# Patient Record
Sex: Male | Born: 1955
Health system: Southern US, Community
[De-identification: ages and names within clinical notes are randomized; demographics above are authoritative.]

## PROBLEM LIST (undated history)

## (undated) DIAGNOSIS — K2270 Barrett's esophagus without dysplasia: Secondary | ICD-10-CM

## (undated) DIAGNOSIS — Z973 Presence of spectacles and contact lenses: Secondary | ICD-10-CM

## (undated) DIAGNOSIS — M1990 Unspecified osteoarthritis, unspecified site: Secondary | ICD-10-CM

## (undated) DIAGNOSIS — K219 Gastro-esophageal reflux disease without esophagitis: Secondary | ICD-10-CM

## (undated) DIAGNOSIS — M109 Gout, unspecified: Secondary | ICD-10-CM

## (undated) DIAGNOSIS — Z86010 Personal history of colonic polyps: Principal | ICD-10-CM

## (undated) DIAGNOSIS — E785 Hyperlipidemia, unspecified: Secondary | ICD-10-CM

## (undated) HISTORY — PX: TONSILLECTOMY: SUR1361

## (undated) HISTORY — PX: VASECTOMY: SHX75

## (undated) HISTORY — DX: Gastro-esophageal reflux disease without esophagitis: K21.9

## (undated) HISTORY — PX: NASAL POLYP EXCISION: SHX2068

## (undated) HISTORY — PX: COLONOSCOPY: SHX174

## (undated) HISTORY — DX: Personal history of colonic polyps: Z86.010

## (undated) HISTORY — DX: Gout, unspecified: M10.9

## (undated) HISTORY — DX: Hyperlipidemia, unspecified: E78.5

## (undated) HISTORY — PX: UPPER GASTROINTESTINAL ENDOSCOPY: SHX188

## (undated) HISTORY — DX: Barrett's esophagus without dysplasia: K22.70

---

## 2006-10-30 HISTORY — PX: SHOULDER ARTHROSCOPY W/ ROTATOR CUFF REPAIR: SHX2400

## 2006-10-30 HISTORY — PX: KNEE ARTHROPLASTY: SHX992

## 2007-05-17 ENCOUNTER — Encounter: Admission: RE | Admit: 2007-05-17 | Discharge: 2007-05-17 | Payer: Self-pay | Admitting: Occupational Medicine

## 2007-07-17 ENCOUNTER — Ambulatory Visit (HOSPITAL_BASED_OUTPATIENT_CLINIC_OR_DEPARTMENT_OTHER): Admission: RE | Admit: 2007-07-17 | Discharge: 2007-07-17 | Payer: Self-pay | Admitting: Orthopedic Surgery

## 2007-08-22 ENCOUNTER — Ambulatory Visit (HOSPITAL_BASED_OUTPATIENT_CLINIC_OR_DEPARTMENT_OTHER): Admission: RE | Admit: 2007-08-22 | Discharge: 2007-08-22 | Payer: Self-pay | Admitting: Orthopedic Surgery

## 2008-02-12 ENCOUNTER — Encounter: Payer: Self-pay | Admitting: Family Medicine

## 2008-02-12 LAB — CONVERTED CEMR LAB
Cholesterol: 217 mg/dL
LDL Cholesterol: 140 mg/dL
Triglycerides: 207 mg/dL
VLDL: 41 mg/dL

## 2008-03-03 ENCOUNTER — Ambulatory Visit: Payer: Self-pay | Admitting: Internal Medicine

## 2008-03-13 ENCOUNTER — Encounter: Payer: Self-pay | Admitting: Internal Medicine

## 2008-03-13 ENCOUNTER — Ambulatory Visit: Payer: Self-pay | Admitting: Internal Medicine

## 2008-03-13 DIAGNOSIS — Z860101 Personal history of adenomatous and serrated colon polyps: Secondary | ICD-10-CM | POA: Insufficient documentation

## 2008-03-13 DIAGNOSIS — Z86010 Personal history of colonic polyps: Secondary | ICD-10-CM

## 2008-03-13 DIAGNOSIS — K2270 Barrett's esophagus without dysplasia: Secondary | ICD-10-CM

## 2008-03-13 HISTORY — DX: Barrett's esophagus without dysplasia: K22.70

## 2008-03-13 HISTORY — DX: Personal history of colonic polyps: Z86.010

## 2008-03-17 ENCOUNTER — Encounter: Payer: Self-pay | Admitting: Internal Medicine

## 2008-09-19 IMAGING — CR DG KNEE COMPLETE 4+V*R*
4 series · 4 of 4 positions shown · non-contrast
Comparison: none

CLINICAL DATA: Twisting injury. Pain.
 RIGHT KNEE - 4 VIEW:

[view not recorded (1 of 4)]
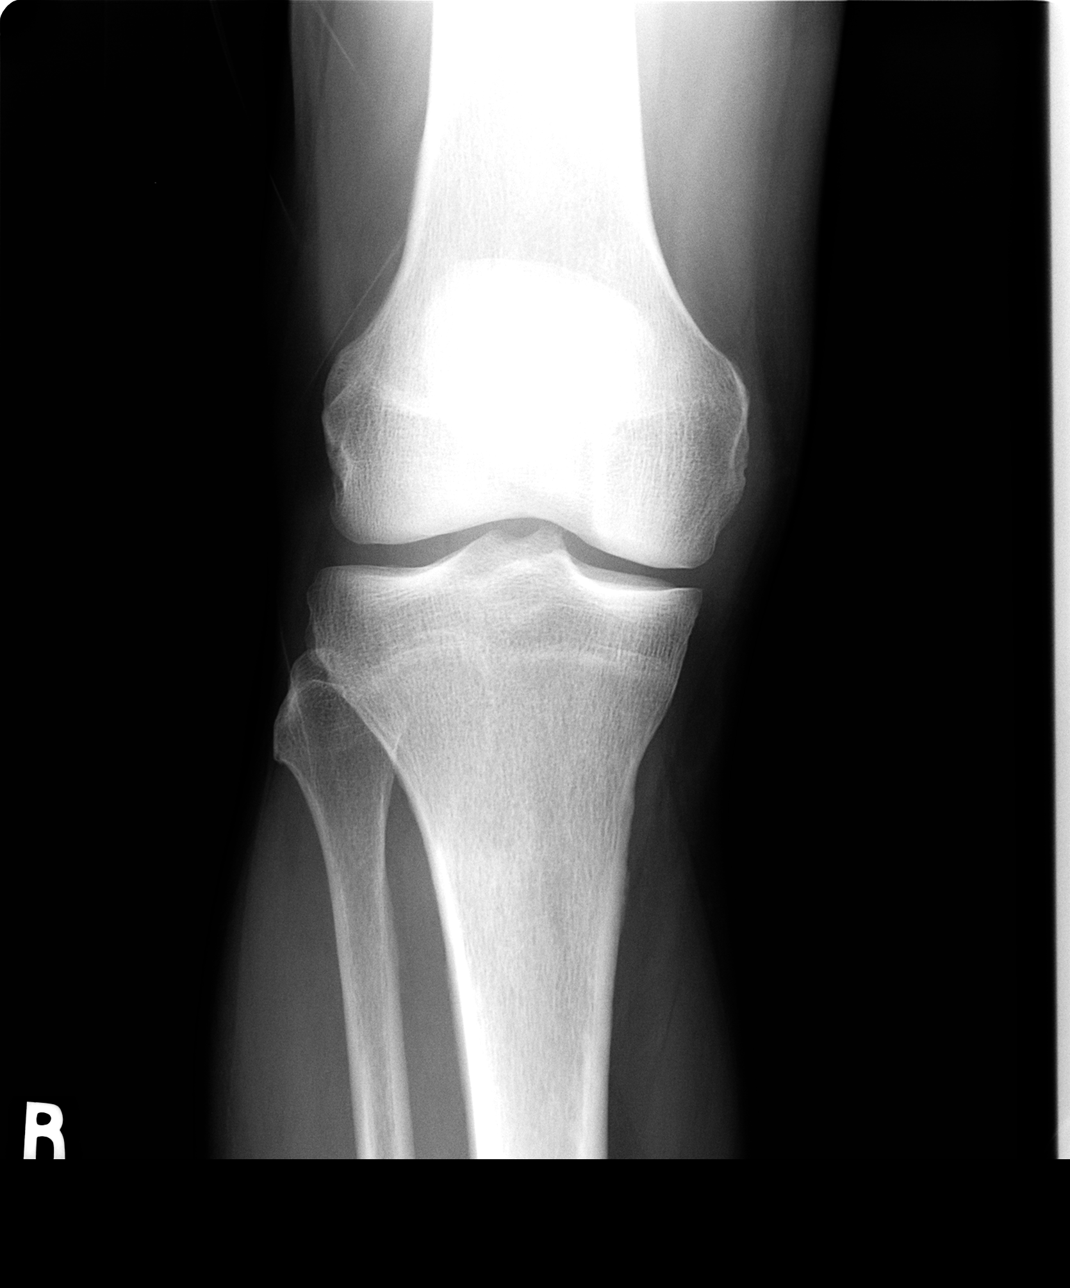

[view not recorded (2 of 4)]
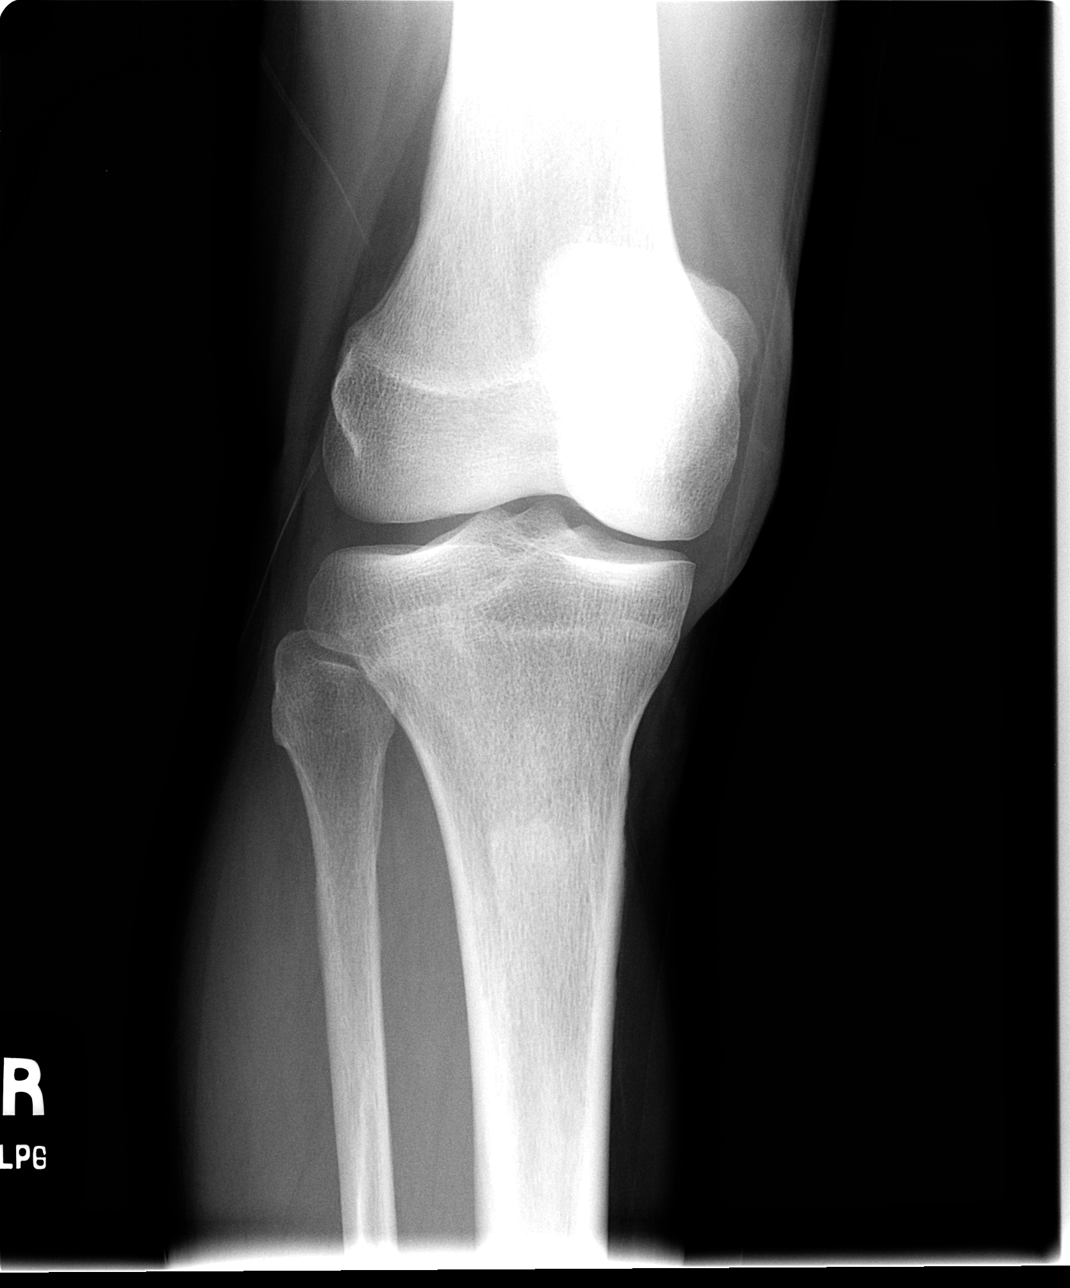

[view not recorded (3 of 4)]
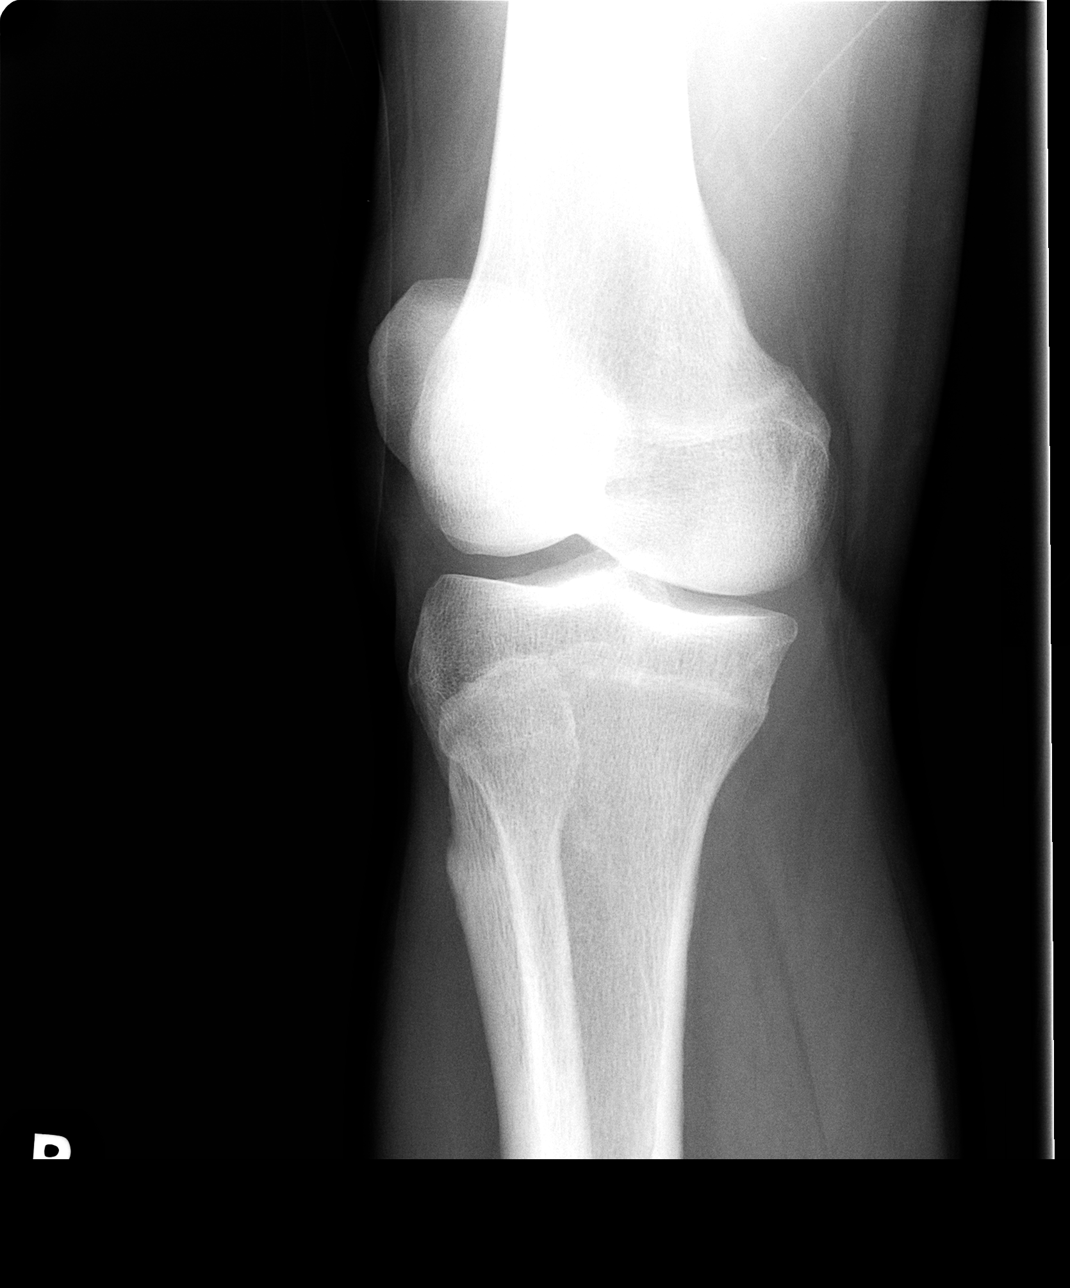

[view not recorded (4 of 4)]
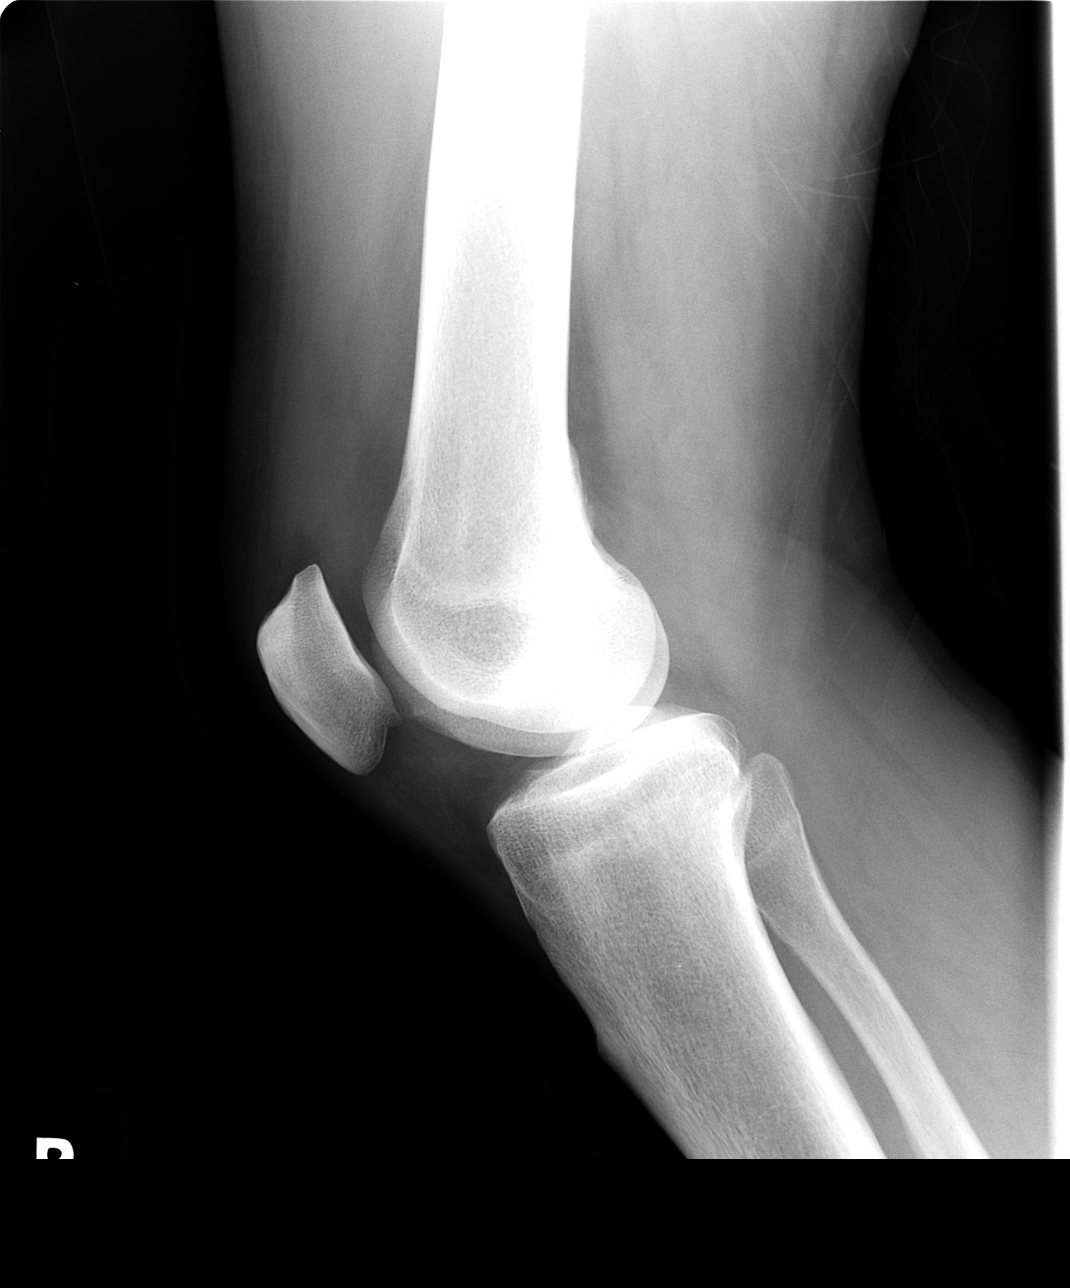

[4 of 4 positions shown; findings below may reference images not displayed]

FINDINGS: The patient has a small joint effusion. No fracture. Mild medial compartment and patellofemoral compartment degenerative change noted.
IMPRESSION: 1.  Negative for fracture.
 2.  Mild appearing patellofemoral medial compartment degenerative disease. 
 3.  Small joint effusion.

## 2009-02-02 ENCOUNTER — Encounter (INDEPENDENT_AMBULATORY_CARE_PROVIDER_SITE_OTHER): Payer: Self-pay | Admitting: *Deleted

## 2009-10-30 HISTORY — PX: SHOULDER ARTHROSCOPY: SHX128

## 2009-11-12 ENCOUNTER — Telehealth: Payer: Self-pay | Admitting: Internal Medicine

## 2010-03-31 ENCOUNTER — Ambulatory Visit (HOSPITAL_BASED_OUTPATIENT_CLINIC_OR_DEPARTMENT_OTHER): Admission: RE | Admit: 2010-03-31 | Discharge: 2010-03-31 | Payer: Self-pay | Admitting: Orthopedic Surgery

## 2010-06-10 ENCOUNTER — Ambulatory Visit: Payer: Self-pay | Admitting: Family Medicine

## 2010-06-10 DIAGNOSIS — E785 Hyperlipidemia, unspecified: Secondary | ICD-10-CM | POA: Insufficient documentation

## 2010-06-10 DIAGNOSIS — K219 Gastro-esophageal reflux disease without esophagitis: Secondary | ICD-10-CM | POA: Insufficient documentation

## 2010-06-10 DIAGNOSIS — E782 Mixed hyperlipidemia: Secondary | ICD-10-CM | POA: Insufficient documentation

## 2010-06-13 LAB — CONVERTED CEMR LAB
AST: 18 units/L (ref 0–37)
Albumin: 4.5 g/dL (ref 3.5–5.2)
BUN: 13 mg/dL (ref 6–23)
Cholesterol: 200 mg/dL (ref 0–200)
Creatinine, Ser: 0.9 mg/dL (ref 0.4–1.5)
GFR calc non Af Amer: 98.99 mL/min (ref >60)
Glucose, Bld: 83 mg/dL (ref 70–99)
HDL: 36.4 mg/dL — ABNORMAL LOW (ref >39.00)
LDL Cholesterol: 135 mg/dL — ABNORMAL HIGH (ref 0–99)
PSA: 0.88 ng/mL (ref 0.10–4.00)
Potassium: 5.1 meq/L (ref 3.5–5.1)
Total Bilirubin: 0.6 mg/dL (ref 0.3–1.2)
Triglycerides: 145 mg/dL (ref 0.0–149.0)
VLDL: 29 mg/dL (ref 0.0–40.0)

## 2010-06-21 ENCOUNTER — Encounter: Payer: Self-pay | Admitting: Family Medicine

## 2010-10-30 HISTORY — PX: KNEE ARTHROSCOPY: SUR90

## 2010-11-30 NOTE — Assessment & Plan Note (Signed)
Summary: NEW PT TO EST/CPX/CLE   Vital Signs:  Patient profile:   55 year old male Height:      68 inches Weight:      161.75 pounds BMI:     24.68 Temp:     98.2 degrees F oral Pulse rate:   80 / minute Pulse rhythm:   regular BP sitting:   102 / 68  (left arm) Cuff size:   regular  Vitals Entered By: Delilah Shan CMA Brylie Sneath Dull) (June 10, 2010 9:47 AM) CC: New Patient to Establish, Preventive Care   History of Present Illness: CPE: prev colonoscopy done.  due for labs.  See prev med.   Insomnia after daughter died in MVA.  Takes xanax rarely.  Mood appropriate.  No SI/HI.  Exercising.   Elevated Cholesterol: due for labs Using medications without problems:yes Muscle aches: no Other complaints: no    Preventive Screening-Counseling & Management  Alcohol-Tobacco     Smoking Status: current     Smoking Cessation Counseling: yes  Caffeine-Diet-Exercise     Does Patient Exercise: yes      Drug Use:  no.    Allergies (verified): 1)  ! Chantix  Past History:  Family History: Last updated: 06/10/2010 Family History Lung cancer, parents Family History of Heart Disease, parents F dead, 20 years of age, h/o MI, CABG, lung CA, smoker, coal miner M dead,  ~55 years of age, colon CA with colostomy  Social History: Last updated: 06/10/2010 Marital Status: Divorced Children: daughter died 41 in MVA.  Has a son also Occupation: Personnel officer at [employer] Current Smoker, <1ppd, working on quitting as of 2011 Alcohol use-yes Drug use-no Regular exercise-yes From [place] enjoys soccer  Past Medical History: HYPERLIPIDEMIA (ICD-272.4) GERD (ICD-530.81) COLONIC POLYPS, HX OF (ICD-V12.72) Insomnia  Past Surgical History: Right and left shoulder- Dr. Eulah Pont with ortho Right knee  Family History: Reviewed history and no changes required. Family History Lung cancer, parents Family History of Heart Disease, parents F dead, 57 years of age, h/o MI, CABG, lung CA,  smoker, coal miner M dead,  ~55 years of age, colon CA with colostomy  Social History: Reviewed history and no changes required. Marital Status: Divorced Children: daughter died 73 in MVA.  Has a son also Occupation: Personnel officer at [employer] Current Smoker, <1ppd, working on quitting as of 2011 Alcohol use-yes Drug use-no Regular exercise-yes From [place] enjoys soccerSmoking Status:  current Drug Use:  no Does Patient Exercise:  yes  Review of Systems       See HPI.  Otherwise negative.    Physical Exam  General:  GEN: nad, alert and oriented HEENT: mucous membranes moist NECK: supple w/o LA CV: rrr.  no murmur PULM: ctab, no inc wob ABD: soft, +bs EXT: no edema SKIN: no acute rash  Rectal:  No external abnormalities noted. Normal sphincter tone. No rectal masses or tenderness. Prostate:  Prostate gland firm and smooth, no enlargement, nodularity, tenderness, mass, asymmetry or induration.   Impression & Recommendations:  Problem # 1:  Preventive Health Care (ICD-V70.0) See prev med.  D/w patient QM:VHQIONGE smoking.  No target stop date yet.   Problem # 2:  HYPERLIPIDEMIA (ICD-272.4) No change in meds. Keep exercising.  His updated medication list for this problem includes:    Lipitor 20 Mg Tabs (Atorvastatin calcium) .Marland Kitchen... Take 1 tab by mouth at bedtime (sometimes).  alt. with red yeast rice  Orders: TLB-Lipid Panel (80061-LIPID) TLB-BMP (Basic Metabolic Panel-BMET) (80048-METABOL) TLB-Hepatic/Liver Function Pnl (80076-HEPATIC)  Problem # 3:  Screening PSA (ICD-V76.44) Normal.  See labs.   Complete Medication List: 1)  Oxycodone-acetaminophen 5-500 Mg Caps (Oxycodone-acetaminophen) .... ? mg.  given by surgeon for shoulder surgery 9 weeks ago. 2)  Lipitor 20 Mg Tabs (Atorvastatin calcium) .... Take 1 tab by mouth at bedtime (sometimes).  alt. with red yeast rice 3)  Xanax 0.25 Mg Tabs (Alprazolam) .... ? mg.   take 1/2 tablet by mouth at bedtime 4)  Aspir-low  81 Mg Tbec (Aspirin) .Marland Kitchen.. 1 by mouth once daily  Other Orders: TLB-PSA (Prostate Specific Antigen) (84153-PSA)  Colorectal Screening:  Current Recommendations:    Hemoccult: NEG X 1 today  Patient Instructions: 1)  We'll contact you with your lab report.  Take care.   Current Allergies (reviewed today): ! CHANTIX    Orders Added: 1)  New Patient 40-64 years [99386] 2)  TLB-PSA (Prostate Specific Antigen) [84153-PSA] 3)  TLB-Lipid Panel [80061-LIPID] 4)  TLB-BMP (Basic Metabolic Panel-BMET) [80048-METABOL] 5)  TLB-Hepatic/Liver Function Pnl [80076-HEPATIC]     Prevention & Chronic Care Immunizations   Influenza vaccine: Not documented   Influenza vaccine due: 06/30/2010    Tetanus booster: Not documented    Pneumococcal vaccine: Not documented   Pneumococcal vaccine due: 02/17/2019  Colorectal Screening   Hemoccult: Not documented   Hemoccult action/deferral: NEG X 1 today  (06/10/2010)    Colonoscopy: Location:  [facility].    (03/13/2008)   Colonoscopy due: 02/2013  Other Screening   PSA: Not documented   PSA ordered.   Smoking status: current  (06/10/2010)   Smoking cessation counseling: yes  (06/10/2010)  Lipids   Total Cholesterol: Not documented   LDL: Not documented   LDL Direct: Not documented   HDL: Not documented   Triglycerides: Not documented    SGOT (AST): Not documented   SGPT (ALT): Not documented   Alkaline phosphatase: Not documented   Total bilirubin: Not documented    Lipid flowsheet reviewed?: Yes  Self-Management Support :    Lipid self-management support: Not documented     Appended Document: NEW PT TO EST/CPX/CLE    Clinical Lists Changes  Observations: Added new observation of PAST MED HX: HYPERLIPIDEMIA (ICD-272.4) GERD (ICD-530.81) COLONIC POLYPS, HX OF (ICD-V12.72) Insomnia Barrett's esophagus 2009 (06/15/2010 19:29) Added new observation of COLONNXTDUE: 03/13/2013 (06/15/2010 19:29) Added  new observation of DM PROGRESS: N/A (06/15/2010 19:29) Added new observation of DM FSREVIEW: N/A (06/15/2010 19:29) Added new observation of HTN PROGRESS: N/A (06/15/2010 19:29) Added new observation of HTN FSREVIEW: N/A (06/15/2010 19:29)       Prevention & Chronic Care Immunizations   Influenza vaccine: Not documented   Influenza vaccine due: 06/30/2010    Tetanus booster: Not documented    Pneumococcal vaccine: Not documented   Pneumococcal vaccine due: 02/17/2019  Colorectal Screening   Hemoccult: Not documented   Hemoccult action/deferral: NEG X 1 today  (06/10/2010)    Colonoscopy: Location:  [facility].    (03/13/2008)   Colonoscopy due: 03/13/2013  Other Screening   PSA: 0.88  (06/10/2010)   Smoking status: current  (06/10/2010)   Smoking cessation counseling: yes  (06/10/2010)  Lipids   Total Cholesterol: 200  (06/10/2010)   LDL: 135  (06/10/2010)   LDL Direct: Not documented   HDL: 36.40  (06/10/2010)   Triglycerides: 145.0  (06/10/2010)    SGOT (AST): 18  (06/10/2010)   SGPT (ALT): 18  (06/10/2010)   Alkaline phosphatase: 79  (06/10/2010)   Total bilirubin: 0.6  (  06/10/2010)  Self-Management Support :    Lipid self-management support: Not documented     Past History:  Past Medical History: HYPERLIPIDEMIA (ICD-272.4) GERD (ICD-530.81) COLONIC POLYPS, HX OF (ICD-V12.72) Insomnia Barrett's esophagus 2009

## 2010-11-30 NOTE — Miscellaneous (Signed)
  Clinical Lists Changes  Observations: Added new observation of TD BOOSTER: Tdap (03/14/2010 17:11) Added new observation of CREATININE: 0.74 mg/dL (69/62/9528 41:32) Added new observation of VLDL: 41 mg/dL (44/10/270 53:66) Added new observation of LDL: 140 mg/dL (44/12/4740 59:56) Added new observation of HDL: 36 mg/dL (38/75/6433 29:51) Added new observation of TRIGLYC TOT: 207 mg/dL (88/41/6606 30:16) Added new observation of CHOLESTEROL: 217 mg/dL (11/07/3233 57:32)      Immunization History:  Tetanus/Td Immunization History:    Tetanus/Td:  tdap (03/14/2010)

## 2010-11-30 NOTE — Progress Notes (Signed)
Summary: Schedule Endoscopy  Phone Note Outgoing Call Call back at [facility] Phone [phone]   Call placed by: Harlow Mares CMA Duncan Dull),  November 12, 2009 1:18 PM Call placed to: Patient Summary of Call: patient states that he can not schedule right now but if I would call him back in [place] he would schedule a repeat EGD to follow up on his Barretts.  Initial call taken by: Harlow Mares CMA Duncan Dull),  November 12, 2009 1:18 PM     Appended Document: Schedule Endoscopy called patient and he is still not ready to schedule his egd but he will call back when he is ready

## 2010-11-30 NOTE — Miscellaneous (Signed)
  Clinical Lists Changes  Observations: Added new observation of PSA: 0.71 ng/mL (02/12/2008 17:14)

## 2011-01-16 LAB — POCT HEMOGLOBIN-HEMACUE: Hemoglobin: 15.6 g/dL (ref 13.0–17.0)

## 2011-03-14 NOTE — Op Note (Signed)
NAMEDIONNE, ROSSA                ACCOUNT NO.:  [id number]   MEDICAL RECORD NO.:  [id number]          PATIENT TYPE:  AMB   LOCATION:  DSC                          FACILITY:  [facility]   PHYSICIAN:  Loreta Ave, M.D. DATE OF BIRTH:  09/03/1956   DATE OF PROCEDURE:  08/22/2007  DATE OF DISCHARGE:                               OPERATIVE REPORT   PREOPERATIVE DIAGNOSIS:  Right shoulder posttraumatic impingement,  partial tearing rotator cuff above and below.  Distal clavicle  osteolysis.   POSTOPERATIVE DIAGNOSIS:  Same, with also complex tearing anterior  posterior labrum.  Secondary adhesive capsulitis.   PROCEDURE:  Right shoulder exam under anesthesia with manipulation.  Arthroscopy, debridement of rotator cuff above and below.  Debridement  of labrum.  Acromioplasty with CA ligament release.  Excision of distal  clavicle.   SURGEON:  Loreta Ave, M.D.   ASSISTANT:  Genene Churn. Denton Meek.   ANESTHESIA:  General.   BLOOD LOSS:  Minimal.   SPECIMENS:  None.   CULTURES:  None.   COMPLICATIONS:  None.   PROCEDURE:  Soft compressive, with sling.   PROCEDURE:  The patient was brought to the operating room and placed on  operating table in the supine position.  After adequate anesthesia had  obtained, right shoulder examined.  Reasonable rotation, but overhead  motion limited by early adhesions from injury and disuse as a result of  pain.  Difficulty getting past 90 degrees of abduction and forward  flexion.  Shoulder manipulated, achieving full motion, maintaining  stable shoulder.  Placed in a beach-chair position on the shoulder  positioner, prepped and draped in the usual sterile fashion.  Three  portals, anterior, posterior, and lateral.  Shoulder entered with blunt  obturator.  Arthroscope introduced.  Shoulder distended inspected.  Residual adhesions and the hemarthrosis from manipulation debrided.  Complex tearing anterior and posterior labrum debrided.  A lot  of  partial tearing throughout the crescent region of the supraspinatus  tendon.  All of this was debrided.  Cable well anchored.  No full-  thickness tears.  Biceps tendon, biceps anchor, capsule ligamentous  structures intact.  Cannula redirected subacromially.  Type 3 acromion.  Reactive bursitis.  Abrasions on the top of the cuff, but no full-  thickness tears.  Bursa resected.  Cuff debrided.  Acromioplasty to a  type 1 acromion, releasing CA ligament with cautery.  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  Periarticular spurs.  Lateral centimeter of clavicle resected.  Adequacy of decompression and  cuff debridement  confirmed, viewing from all portals.  Instruments and fluid removed.  Portals closed with nylon.  Sterile compressive dressing applied.  Sling  applied.  Anesthesia reversed.  Brought to the recovery room.  Tolerated  surgery well.  No complications.      Loreta Ave, M.D.  Electronically Signed     DFM/MEDQ  D:  08/22/2007  T:  08/23/2007  Job:  [id number]

## 2011-03-14 NOTE — Op Note (Signed)
Bryce Cameron, Bryce Cameron                ACCOUNT NO.:  [id number]   MEDICAL RECORD NO.:  [id number]          PATIENT TYPE:  AMB   LOCATION:  DSC                          FACILITY:  [facility]   PHYSICIAN:  Loreta Ave, M.D. DATE OF BIRTH:  1956/03/02   DATE OF PROCEDURE:  07/17/2007  DATE OF DISCHARGE:                               OPERATIVE REPORT   PREOPERATIVE DIAGNOSIS:  Medial meniscus tear, right knee.   POSTOPERATIVE DIAGNOSIS:  Medial meniscus tear, right knee.   PROCEDURE:  Right knee exam under anesthesia, arthroscopy, partial  medial meniscectomy.   SURGEON:  Loreta Ave, M.D.   ASSISTANT:  Genene Churn. Owens, P.A.-C.   ANESTHESIA:  Knee block with sedation.   SPECIMENS:  None.   CULTURES:  None.   COMPLICATIONS:  None.   DRESSINGS:  Soft compressive.   DESCRIPTION OF PROCEDURE:  The patient was brought to the operating room  and placed on the operating table in supine position.  After adequate  anesthesia had been obtained, right knee examined.  Positive medial  McMurray's, full motion, stable ligaments.  Tourniquet and leg holder  were applied.  Leg prepped and draped in the usual sterile fashion.  Three portals, one superolateral, one each medial and lateral  parapatellar.  Inflow catheter introduced, the knee distended, the  arthroscope was introduced, and the knee inspected.  Articular cartilage  intact throughout.  Complex irreparable tearing medial meniscus.  Posterior half removed tapered into remaining meniscus.  Cruciate  ligaments, lateral meniscus, lateral compartment, patellofemoral  tracking were all normal.  Instruments and fluid removed.  Portals of  the knee were injected with Marcaine.  Portals closed with 4-0 nylon.  Sterile compressive dressing applied.  Anesthesia reversed.  Brought to  the recovery room.  Tolerated surgery well.  No complications.      Loreta Ave, M.D.  Electronically Signed     DFM/MEDQ  D:  07/17/2007  T:   07/17/2007  Job:  [id number]

## 2011-03-23 ENCOUNTER — Ambulatory Visit (HOSPITAL_BASED_OUTPATIENT_CLINIC_OR_DEPARTMENT_OTHER)
Admission: RE | Admit: 2011-03-23 | Discharge: 2011-03-23 | Disposition: A | Discharge status: Home / Self Care | Payer: 59 | Source: Ambulatory Visit | Attending: Orthopedic Surgery | Admitting: Orthopedic Surgery

## 2011-03-23 DIAGNOSIS — J4489 Other specified chronic obstructive pulmonary disease: Secondary | ICD-10-CM | POA: Insufficient documentation

## 2011-03-23 DIAGNOSIS — F17200 Nicotine dependence, unspecified, uncomplicated: Secondary | ICD-10-CM | POA: Insufficient documentation

## 2011-03-23 DIAGNOSIS — M6750 Plica syndrome, unspecified knee: Secondary | ICD-10-CM | POA: Insufficient documentation

## 2011-03-23 DIAGNOSIS — J449 Chronic obstructive pulmonary disease, unspecified: Secondary | ICD-10-CM | POA: Insufficient documentation

## 2011-03-23 DIAGNOSIS — M23329 Other meniscus derangements, posterior horn of medial meniscus, unspecified knee: Secondary | ICD-10-CM | POA: Insufficient documentation

## 2011-03-26 NOTE — Op Note (Signed)
  Bryce Cameron, Bryce Cameron                ACCOUNT NO.:  [id number]  MEDICAL RECORD NO.:  [id number]          PATIENT TYPE:  LOCATION:                                 FACILITY:  PHYSICIAN:  Loreta Ave, M.D.      DATE OF BIRTH:  DATE OF PROCEDURE:  03/23/2011 DATE OF DISCHARGE:                              OPERATIVE REPORT   PREOPERATIVE DIAGNOSIS:  Left knee medial meniscus tear.  POSTOPERATIVE DIAGNOSIS:  Left knee medial meniscus tear with also inflamed medial plica.  PROCEDURES:  Left knee exam under anesthesia, arthroscopy with debridement of medial meniscus, excision of medial plica.  SURGEON:  Loreta Ave, MD  ASSISTANT:  Genene Churn. Barry Dienes, [place]  ANESTHESIA:  General.  BLOOD LOSS:  Minimal.  SPECIMENS:  None.  CULTURES:  None.  COMPLICATIONS:  None.  DRESSING:  Soft compressive.  TOURNIQUET:  None employed.  PROCEDURE:  The patient brought to the operating room, placed on the operating table in supine position.  After adequate anesthesia had been obtained, knee examined.  Good motion, good stability.  Leg holder applied.  Leg prepped and draped in usual sterile fashion.  Two portals, one each medial and lateral parapatellar.  Arthroscope was introduced, knee distended and inspected.  Good patellofemoral tracking.  Articular cartilage looked good throughout.  Large fibrotic medial plica with synovitis along the margin was excised.  Some abrasive changes on the margin of the condyle debrided.  Medial meniscus complex tearing middle posterior third with flaps folded underneath.  Saucerized up to a stable rim, tapered into remaining meniscus.  Articular cartilage there looked good.  Cruciate ligaments intact.  Lateral meniscus, lateral compartment looked good.  The entire knee examined, all the findings appreciated.  Instruments and fluid removed.  Intraarticular injection Depo-Medrol and Marcaine.  Portals closed with nylon.  Sterile compressive dressing  applied.  Anesthesia reversed.  Brought to the recovery room.  Tolerated surgery well.  No complications.     Loreta Ave, M.D.     DFM/MEDQ  D:  03/23/2011  T:  03/23/2011  Job:  [id number]  Electronically Signed by Mckinley Jewel M.D. on 03/26/2011 11:38:14 AM

## 2011-08-09 LAB — POCT HEMOGLOBIN-HEMACUE: Operator id: 208731

## 2011-08-10 LAB — POCT HEMOGLOBIN-HEMACUE
Hemoglobin: 17
Operator id: 123881

## 2012-06-12 ENCOUNTER — Ambulatory Visit (INDEPENDENT_AMBULATORY_CARE_PROVIDER_SITE_OTHER): Payer: 59 | Admitting: Family Medicine

## 2012-06-12 VITALS — BP 124/80 | HR 72 | Temp 98.2°F | Resp 16 | Ht 69.0 in | Wt 168.2 lb

## 2012-06-12 DIAGNOSIS — H5711 Ocular pain, right eye: Secondary | ICD-10-CM

## 2012-06-12 DIAGNOSIS — H00019 Hordeolum externum unspecified eye, unspecified eyelid: Secondary | ICD-10-CM

## 2012-06-12 DIAGNOSIS — H5710 Ocular pain, unspecified eye: Secondary | ICD-10-CM

## 2012-06-12 MED ORDER — HYDROCODONE-ACETAMINOPHEN 5-325 MG PO TABS: 1.0000 | ORAL_TABLET | Freq: Four times a day (QID) | ORAL | Status: AC | PRN

## 2012-06-12 MED ORDER — CEPHALEXIN 500 MG PO CAPS: 500.0000 mg | ORAL_CAPSULE | Freq: Two times a day (BID) | ORAL | Status: AC

## 2012-06-12 MED ORDER — ERYTHROMYCIN 5 MG/GM OP OINT: TOPICAL_OINTMENT | OPHTHALMIC | Status: DC

## 2012-06-12 NOTE — Patient Instructions (Signed)
Start ointment for eye, and antibioitic by mouth.  Use hot compresses 3-4 times per day.  If any vision changes, fevers, or other worsening return to clinic or emergency room.  Recheck in 2-3 days if not improving.  Return to the clinic or go to the nearest emergency room if any of your symptoms worsen or new symptoms occur. Sty A sty (hordeolum) is an infection of a gland in the eyelid located at the base of the eyelash. A sty may develop a white or yellow head of pus. It can be puffy (swollen). Usually, the sty will burst and pus will come out on its own. They do not leave lumps in the eyelid once they drain. A sty is often confused with another form of cyst of the eyelid called a chalazion. Chalazions occur within the eyelid and not on the edge where the bases of the eyelashes are. They often are red, sore and then form firm lumps in the eyelid. CAUSES   Germs (bacteria).   Lasting (chronic) eyelid inflammation.  SYMPTOMS   Tenderness, redness and swelling along the edge of the eyelid at the base of the eyelashes.   Sometimes, there is a white or yellow head of pus. It may or may not drain.  DIAGNOSIS  An ophthalmologist will be able to distinguish between a sty and a chalazion and treat the condition appropriately.  TREATMENT   Styes are typically treated with warm packs (compresses) until drainage occurs.   In rare cases, medicines that kill germs (antibiotics) may be prescribed. These antibiotics may be in the form of drops, cream or pills.   If a hard lump has formed, it is generally necessary to do a small incision and remove the hardened contents of the cyst in a minor surgical procedure done in the office.   In suspicious cases, your caregiver may send the contents of the cyst to the lab to be certain that it is not a rare, but dangerous form of cancer of the glands of the eyelid.  HOME CARE INSTRUCTIONS   Wash your hands often and dry them with a clean towel. Avoid touching  your eyelid. This may spread the infection to other parts of the eye.   Apply heat to your eyelid for 10 to 20 minutes, several times a day, to ease pain and help to heal it faster.   Do not squeeze the sty. Allow it to drain on its own. Wash your eyelid carefully 3 to 4 times per day to remove any pus.  SEEK IMMEDIATE MEDICAL CARE IF:   Your eye becomes painful or puffy (swollen).   Your vision changes.   Your sty does not drain by itself within 3 days.   Your sty comes back within a short period of time, even with treatment.   You have redness (inflammation) around the eye.   You have a fever.  Document Released: 07/26/2005 Document Revised: 10/05/2011 Document Reviewed: 03/30/2009 [facility] Patient Information 2012 [place], [place].

## 2012-06-12 NOTE — Progress Notes (Signed)
Subjective:    Patient ID: Bryce Cameron, male    DOB: 09/28/1956, 56 y.o.   MRN: [id number]  HPI Bryce Cameron is a 56 y.o. male Stye in l upper lid one week ago - drained and did ok.  Sweating with playing soccer.  R lower eyelid now bothering him past 5 days.  Tried prior rx drops - unknown name for allergies - no relief.  More swollen, sore.  Ha with pain at times.  Denies blurry vision.  No fever.  Hot compresses once at night.  Less swollen now than this am.  tx ibuprofen 800 mg this am.       Review of Systems  Constitutional: Negative for fever and chills.  Eyes: Positive for pain and redness. Negative for visual disturbance.   As above    Objective:   Physical Exam  Constitutional: He is oriented to person, place, and time. He appears well-developed and well-nourished.  HENT:  Head: Normocephalic and atraumatic.  Eyes: Conjunctivae and EOM are normal. Pupils are equal, round, and reactive to light. Right eye exhibits no discharge, no exudate and no hordeolum. Left eye exhibits discharge (slight crust on lower lid. ) and hordeolum.    Cardiovascular: Normal rate, regular rhythm, normal heart sounds and intact distal pulses.   Pulmonary/Chest: Effort normal.  Neurological: He is alert and oriented to person, place, and time.  Skin: Skin is warm and dry. There is erythema.  Psychiatric: He has a normal mood and affect. His behavior is normal.      Assessment & Plan:  Bryce Cameron is a 56 y.o. male\ 1. Pain in right eye  erythromycin ophthalmic ointment, cephALEXin (KEFLEX) 500 MG capsule, HYDROcodone-acetaminophen (NORCO/VICODIN) 5-325 MG per tablet  2. Hordeolum  erythromycin ophthalmic ointment, cephALEXin (KEFLEX) 500 MG capsule   Stye with worsening pain and periorbital pain.  ddx includes early periorbital cellulitis, but doubt at this point.  meds as above, rtc precautions.  Patient Instructions  Start ointment for eye, and antibioitic by mouth.  Use hot  compresses 3-4 times per day.  If any vision changes, fevers, or other worsening return to clinic or emergency room.  Recheck in 2-3 days if not improving.  Return to the clinic or go to the nearest emergency room if any of your symptoms worsen or new symptoms occur. Sty A sty (hordeolum) is an infection of a gland in the eyelid located at the base of the eyelash. A sty may develop a white or yellow head of pus. It can be puffy (swollen). Usually, the sty will burst and pus will come out on its own. They do not leave lumps in the eyelid once they drain. A sty is often confused with another form of cyst of the eyelid called a chalazion. Chalazions occur within the eyelid and not on the edge where the bases of the eyelashes are. They often are red, sore and then form firm lumps in the eyelid. CAUSES   Germs (bacteria).   Lasting (chronic) eyelid inflammation.  SYMPTOMS   Tenderness, redness and swelling along the edge of the eyelid at the base of the eyelashes.   Sometimes, there is a white or yellow head of pus. It may or may not drain.  DIAGNOSIS  An ophthalmologist will be able to distinguish between a sty and a chalazion and treat the condition appropriately.  TREATMENT   Styes are typically treated with warm packs (compresses) until drainage occurs.   In rare cases, medicines that kill germs (  antibiotics) may be prescribed. These antibiotics may be in the form of drops, cream or pills.   If a hard lump has formed, it is generally necessary to do a small incision and remove the hardened contents of the cyst in a minor surgical procedure done in the office.   In suspicious cases, your caregiver may send the contents of the cyst to the lab to be certain that it is not a rare, but dangerous form of cancer of the glands of the eyelid.  HOME CARE INSTRUCTIONS   Wash your hands often and dry them with a clean towel. Avoid touching your eyelid. This may spread the infection to other parts of  the eye.   Apply heat to your eyelid for 10 to 20 minutes, several times a day, to ease pain and help to heal it faster.   Do not squeeze the sty. Allow it to drain on its own. Wash your eyelid carefully 3 to 4 times per day to remove any pus.  SEEK IMMEDIATE MEDICAL CARE IF:   Your eye becomes painful or puffy (swollen).   Your vision changes.   Your sty does not drain by itself within 3 days.   Your sty comes back within a short period of time, even with treatment.   You have redness (inflammation) around the eye.   You have a fever.  Document Released: 07/26/2005 Document Revised: 10/05/2011 Document Reviewed: 03/30/2009 [facility] Patient Information 2012 [place], [place].

## 2013-04-15 ENCOUNTER — Encounter: Payer: Self-pay | Admitting: Internal Medicine

## 2013-11-07 ENCOUNTER — Other Ambulatory Visit: Payer: Self-pay | Admitting: Physician Assistant

## 2013-11-11 ENCOUNTER — Encounter (HOSPITAL_BASED_OUTPATIENT_CLINIC_OR_DEPARTMENT_OTHER): Payer: Self-pay | Admitting: *Deleted

## 2013-11-11 NOTE — Progress Notes (Signed)
Has been here several times for ortho surgeries-no labs needed
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
## 2013-11-14 ENCOUNTER — Ambulatory Visit (HOSPITAL_BASED_OUTPATIENT_CLINIC_OR_DEPARTMENT_OTHER)
Admission: RE | Admit: 2013-11-14 | Discharge: 2013-11-14 | Disposition: A | Discharge status: Home / Self Care | Payer: Worker's Compensation | Source: Ambulatory Visit | Attending: Orthopedic Surgery | Admitting: Orthopedic Surgery

## 2013-11-14 ENCOUNTER — Encounter (HOSPITAL_BASED_OUTPATIENT_CLINIC_OR_DEPARTMENT_OTHER)
Admission: RE | Disposition: A | Discharge status: Home / Self Care | Payer: Self-pay | Source: Ambulatory Visit | Attending: Orthopedic Surgery

## 2013-11-14 ENCOUNTER — Ambulatory Visit (HOSPITAL_BASED_OUTPATIENT_CLINIC_OR_DEPARTMENT_OTHER): Payer: Worker's Compensation | Admitting: Certified Registered"

## 2013-11-14 ENCOUNTER — Encounter (HOSPITAL_BASED_OUTPATIENT_CLINIC_OR_DEPARTMENT_OTHER): Payer: Self-pay | Admitting: Anesthesiology

## 2013-11-14 ENCOUNTER — Encounter (HOSPITAL_BASED_OUTPATIENT_CLINIC_OR_DEPARTMENT_OTHER): Payer: Worker's Compensation | Admitting: Certified Registered"

## 2013-11-14 DIAGNOSIS — Z87891 Personal history of nicotine dependence: Secondary | ICD-10-CM | POA: Insufficient documentation

## 2013-11-14 DIAGNOSIS — M249 Joint derangement, unspecified: Secondary | ICD-10-CM | POA: Insufficient documentation

## 2013-11-14 DIAGNOSIS — M67919 Unspecified disorder of synovium and tendon, unspecified shoulder: Secondary | ICD-10-CM | POA: Insufficient documentation

## 2013-11-14 DIAGNOSIS — M719 Bursopathy, unspecified: Secondary | ICD-10-CM | POA: Insufficient documentation

## 2013-11-14 DIAGNOSIS — M9420 Chondromalacia, unspecified site: Secondary | ICD-10-CM | POA: Insufficient documentation

## 2013-11-14 HISTORY — PX: SHOULDER INJECTION: SHX5048

## 2013-11-14 HISTORY — PX: ELBOW ARTHROSCOPY: SHX614

## 2013-11-14 HISTORY — DX: Presence of spectacles and contact lenses: Z97.3

## 2013-11-14 HISTORY — DX: Unspecified osteoarthritis, unspecified site: M19.90

## 2013-11-14 LAB — POCT HEMOGLOBIN-HEMACUE: HEMOGLOBIN: 15.4 g/dL (ref 13.0–17.0)

## 2013-11-14 SURGERY — ARTHROSCOPY, ELBOW, WITH OPEN SURGERY IF INDICATED
Anesthesia: General | Site: Shoulder | Laterality: Left

## 2013-11-14 MED ORDER — ONDANSETRON HCL 4 MG PO TABS: 4.0000 mg | ORAL_TABLET | Freq: Four times a day (QID) | ORAL | Status: DC | PRN

## 2013-11-14 MED ORDER — FENTANYL CITRATE 0.05 MG/ML IJ SOLN
INTRAMUSCULAR | Status: AC
  Filled 2013-11-14: qty 4

## 2013-11-14 MED ORDER — ONDANSETRON HCL 4 MG/2ML IJ SOLN: 4.0000 mg | Freq: Once | INTRAMUSCULAR | Status: DC | PRN

## 2013-11-14 MED ORDER — ONDANSETRON HCL 4 MG/2ML IJ SOLN
INTRAMUSCULAR | Status: DC | PRN
  Administered 2013-11-14: 4 mg via INTRAVENOUS

## 2013-11-14 MED ORDER — METHYLPREDNISOLONE ACETATE 40 MG/ML IJ SUSP
INTRAMUSCULAR | Status: AC
  Filled 2013-11-14: qty 1

## 2013-11-14 MED ORDER — BUPIVACAINE HCL (PF) 0.25 % IJ SOLN
INTRAMUSCULAR | Status: AC
  Filled 2013-11-14: qty 30

## 2013-11-14 MED ORDER — MIDAZOLAM HCL 2 MG/2ML IJ SOLN: 1.0000 mg | INTRAMUSCULAR | Status: DC | PRN

## 2013-11-14 MED ORDER — METOCLOPRAMIDE HCL 5 MG PO TABS: 5.0000 mg | ORAL_TABLET | Freq: Three times a day (TID) | ORAL | Status: DC | PRN

## 2013-11-14 MED ORDER — OXYCODONE-ACETAMINOPHEN 5-325 MG PO TABS: 1.0000 | ORAL_TABLET | ORAL | Status: DC | PRN

## 2013-11-14 MED ORDER — FENTANYL CITRATE 0.05 MG/ML IJ SOLN: 50.0000 ug | INTRAMUSCULAR | Status: DC | PRN

## 2013-11-14 MED ORDER — LACTATED RINGERS IV SOLN
INTRAVENOUS | Status: DC
  Administered 2013-11-14: 12:00:00 via INTRAVENOUS

## 2013-11-14 MED ORDER — CHLORHEXIDINE GLUCONATE 4 % EX LIQD: 60.0000 mL | Freq: Once | CUTANEOUS | Status: DC

## 2013-11-14 MED ORDER — METOCLOPRAMIDE HCL 5 MG/ML IJ SOLN: 5.0000 mg | Freq: Three times a day (TID) | INTRAMUSCULAR | Status: DC | PRN

## 2013-11-14 MED ORDER — KETOROLAC TROMETHAMINE 30 MG/ML IJ SOLN
INTRAMUSCULAR | Status: DC | PRN
  Administered 2013-11-14: 30 mg via INTRAVENOUS

## 2013-11-14 MED ORDER — MIDAZOLAM HCL 5 MG/5ML IJ SOLN
INTRAMUSCULAR | Status: DC | PRN
  Administered 2013-11-14: 2 mg via INTRAVENOUS

## 2013-11-14 MED ORDER — MIDAZOLAM HCL 2 MG/2ML IJ SOLN
INTRAMUSCULAR | Status: AC
  Filled 2013-11-14: qty 2

## 2013-11-14 MED ORDER — LACTATED RINGERS IV SOLN: INTRAVENOUS | Status: DC

## 2013-11-14 MED ORDER — DEXAMETHASONE SODIUM PHOSPHATE 4 MG/ML IJ SOLN
INTRAMUSCULAR | Status: DC | PRN
  Administered 2013-11-14: 10 mg via INTRAVENOUS

## 2013-11-14 MED ORDER — ONDANSETRON HCL 4 MG/2ML IJ SOLN: 4.0000 mg | Freq: Four times a day (QID) | INTRAMUSCULAR | Status: DC | PRN

## 2013-11-14 MED ORDER — SODIUM CHLORIDE 0.9 % IR SOLN
Status: DC | PRN
  Administered 2013-11-14: 2000 mL

## 2013-11-14 MED ORDER — PROPOFOL 10 MG/ML IV BOLUS
INTRAVENOUS | Status: DC | PRN
  Administered 2013-11-14: 200 mg via INTRAVENOUS

## 2013-11-14 MED ORDER — METHYLPREDNISOLONE ACETATE 40 MG/ML IJ SUSP
INTRAMUSCULAR | Status: DC | PRN
  Administered 2013-11-14: 13:00:00 via INTRAMUSCULAR

## 2013-11-14 MED ORDER — CEFAZOLIN SODIUM-DEXTROSE 2-3 GM-% IV SOLR
2.0000 g | INTRAVENOUS | Status: AC
  Administered 2013-11-14: 2 g via INTRAVENOUS

## 2013-11-14 MED ORDER — BUPIVACAINE HCL (PF) 0.25 % IJ SOLN
INTRAMUSCULAR | Status: DC | PRN
  Administered 2013-11-14: 10 mL via INTRA_ARTICULAR

## 2013-11-14 MED ORDER — BUPIVACAINE HCL (PF) 0.5 % IJ SOLN
INTRAMUSCULAR | Status: AC
  Filled 2013-11-14: qty 30

## 2013-11-14 MED ORDER — HYDROMORPHONE HCL PF 1 MG/ML IJ SOLN: 0.2500 mg | INTRAMUSCULAR | Status: DC | PRN

## 2013-11-14 MED ORDER — LIDOCAINE HCL (CARDIAC) 20 MG/ML IV SOLN
INTRAVENOUS | Status: DC | PRN
  Administered 2013-11-14: 40 mg via INTRAVENOUS

## 2013-11-14 MED ORDER — FENTANYL CITRATE 0.05 MG/ML IJ SOLN
INTRAMUSCULAR | Status: DC | PRN
  Administered 2013-11-14: 100 ug via INTRAVENOUS
  Administered 2013-11-14 (×3): 25 ug via INTRAVENOUS

## 2013-11-14 SURGICAL SUPPLY — 64 items
BANDAGE ELASTIC 4 VELCRO ST LF (GAUZE/BANDAGES/DRESSINGS) ×6 IMPLANT
BENZOIN TINCTURE PRP APPL 2/3 (GAUZE/BANDAGES/DRESSINGS) IMPLANT
BLADE CUDA GRT WHITE 3.5 (BLADE) IMPLANT
BLADE CUDA SHAVER 3.5 (BLADE) IMPLANT
BLADE CUTTER GATOR 3.5 (BLADE) ×3 IMPLANT
BLADE GREAT WHITE 4.2 (BLADE) IMPLANT
BNDG ESMARK 4X9 LF (GAUZE/BANDAGES/DRESSINGS) ×3 IMPLANT
BUR CUDA 2.9 (BURR) IMPLANT
BUR FULL RADIUS 2.9 (BURR) IMPLANT
BUR GATOR 2.9 (BURR) IMPLANT
BUR OVAL 4.0 (BURR) ×3 IMPLANT
CANISTER SUCT 1200ML W/VALVE (MISCELLANEOUS) IMPLANT
CANISTER SUCT LVC 12 LTR MEDI- (MISCELLANEOUS) ×3 IMPLANT
CUFF TOURNIQUET SINGLE 18IN (TOURNIQUET CUFF) ×3 IMPLANT
CUTTER MENISCUS  4.2MM (BLADE)
CUTTER MENISCUS 4.2MM (BLADE) IMPLANT
DECANTER SPIKE VIAL GLASS SM (MISCELLANEOUS) ×3 IMPLANT
DRAPE ARTHROSCOPY W/POUCH 90 (DRAPES) ×3 IMPLANT
DRAPE OEC MINIVIEW 54X84 (DRAPES) ×3 IMPLANT
DRAPE U-SHAPE 47X51 STRL (DRAPES) ×3 IMPLANT
DURAPREP 26ML APPLICATOR (WOUND CARE) ×3 IMPLANT
GAUZE XEROFORM 1X8 LF (GAUZE/BANDAGES/DRESSINGS) ×3 IMPLANT
GLOVE BIO SURGEON STRL SZ7 (GLOVE) ×3 IMPLANT
GLOVE BIOGEL PI IND STRL 7.0 (GLOVE) ×6 IMPLANT
GLOVE BIOGEL PI IND STRL 7.5 (GLOVE) ×2 IMPLANT
GLOVE BIOGEL PI INDICATOR 7.0 (GLOVE) ×3
GLOVE BIOGEL PI INDICATOR 7.5 (GLOVE) ×1
GLOVE ECLIPSE 6.5 STRL STRAW (GLOVE) ×3 IMPLANT
GLOVE ORTHO TXT STRL SZ7.5 (GLOVE) ×3 IMPLANT
GOWN STRL REUS W/ TWL LRG LVL3 (GOWN DISPOSABLE) ×6 IMPLANT
GOWN STRL REUS W/ TWL XL LVL3 (GOWN DISPOSABLE) ×2 IMPLANT
GOWN STRL REUS W/TWL LRG LVL3 (GOWN DISPOSABLE) ×3
GOWN STRL REUS W/TWL XL LVL3 (GOWN DISPOSABLE) ×1
KIT SHOULDER TRACTION (DRAPES) ×3 IMPLANT
NDL SAFETY ECLIPSE 18X1.5 (NEEDLE) ×2 IMPLANT
NEEDLE HYPO 18GX1.5 SHARP (NEEDLE) ×1
NEEDLE HYPO 25X1 1.5 SAFETY (NEEDLE) IMPLANT
NS IRRIG 1000ML POUR BTL (IV SOLUTION) IMPLANT
PACK ARTHROSCOPY DSU (CUSTOM PROCEDURE TRAY) ×3 IMPLANT
PACK BASIN DAY SURGERY FS (CUSTOM PROCEDURE TRAY) ×3 IMPLANT
PAD CAST 4YDX4 CTTN HI CHSV (CAST SUPPLIES) ×4 IMPLANT
PADDING CAST ABS 4INX4YD NS (CAST SUPPLIES) ×1
PADDING CAST ABS COTTON 4X4 ST (CAST SUPPLIES) ×2 IMPLANT
PADDING CAST COTTON 4X4 STRL (CAST SUPPLIES) ×2
SET ARTHROSCOPY TUBING (MISCELLANEOUS) ×1
SET ARTHROSCOPY TUBING LN (MISCELLANEOUS) ×2 IMPLANT
SHEET MEDIUM DRAPE 40X70 STRL (DRAPES) ×3 IMPLANT
SLEEVE SCD COMPRESS KNEE MED (MISCELLANEOUS) ×3 IMPLANT
SLING ARM FOAM STRAP MED (SOFTGOODS) IMPLANT
SLING ARM LRG ADULT FOAM STRAP (SOFTGOODS) ×3 IMPLANT
SPLINT FAST PLASTER 5X30 (CAST SUPPLIES)
SPLINT PLASTER CAST FAST 5X30 (CAST SUPPLIES) IMPLANT
SPONGE GAUZE 4X4 12PLY (GAUZE/BANDAGES/DRESSINGS) ×6 IMPLANT
STRIP CLOSURE SKIN 1/2X4 (GAUZE/BANDAGES/DRESSINGS) IMPLANT
SUT ETHILON 3 0 PS 1 (SUTURE) IMPLANT
SUT VIC AB 0 CT1 27 (SUTURE)
SUT VIC AB 0 CT1 27XBRD ANBCTR (SUTURE) IMPLANT
SUT VIC AB 2-0 PS2 27 (SUTURE) IMPLANT
SUT VIC AB 2-0 SH 27 (SUTURE) ×1
SUT VIC AB 2-0 SH 27XBRD (SUTURE) ×2 IMPLANT
SYR CONTROL 10ML LL (SYRINGE) ×3 IMPLANT
TOWEL OR 17X24 6PK STRL BLUE (TOWEL DISPOSABLE) ×6 IMPLANT
UNDERPAD 30X30 INCONTINENT (UNDERPADS AND DIAPERS) ×3 IMPLANT
WATER STERILE IRR 1000ML POUR (IV SOLUTION) ×3 IMPLANT

## 2013-11-14 NOTE — Transfer of Care (Signed)
Immediate Anesthesia Transfer of Care Note  Patient: Bryce Cameron  Procedure(s) Performed: Procedure(s): ARTHROSCOPY LEFT ELBOW, SYNOVECTOMY, DEBRIDEMENT, REMOVAL OF LOOSE BODIES (Left) SHOULDER INJECTION (Left)  Patient Location: PACU  Anesthesia Type:General  Level of Consciousness: awake, alert  and oriented  Airway & Oxygen Therapy: Patient Spontanous Breathing and Patient connected to face mask oxygen  Post-op Assessment: Report given to PACU RN and Post -op Vital signs reviewed and stable  Post vital signs: Reviewed and stable  Complications: No apparent anesthesia complications

## 2013-11-14 NOTE — Interval H&P Note (Signed)
History and Physical Interval Note:  11/14/2013 1:04 PM  Bryce Cameron  has presented today for surgery, with the diagnosis of left elbow loose body  The various methods of treatment have been discussed with the patient and family. After consideration of risks, benefits and other options for treatment, the patient has consented to  Procedure(s): ARTHROSCOPY LEFT ELBOW/LOOSE BODY REMOVAL (Left) as a surgical intervention .  The patient's history has been reviewed, patient examined, no change in status, stable for surgery.  I have reviewed the patient's chart and labs.  Questions were answered to the patient's satisfaction. Also subacromial injection for post traumatic impingement left shoulder    Bryce Cameron

## 2013-11-14 NOTE — Anesthesia Procedure Notes (Signed)
Procedure Name: LMA Insertion Performed by: Zahirah Cheslock W Pre-anesthesia Checklist: Patient identified, Timeout performed, Emergency Drugs available, Suction available and Patient being monitored Patient Re-evaluated:Patient Re-evaluated prior to inductionOxygen Delivery Method: Circle system utilized Preoxygenation: Pre-oxygenation with 100% oxygen Intubation Type: IV induction Ventilation: Mask ventilation without difficulty LMA: LMA inserted LMA Size: 4.0 Number of attempts: 1 Placement Confirmation: breath sounds checked- equal and bilateral and positive ETCO2 Tube secured with: Tape Dental Injury: Teeth and Oropharynx as per pre-operative assessment      

## 2013-11-14 NOTE — Anesthesia Preprocedure Evaluation (Signed)
Anesthesia Evaluation  Patient identified by MRN, date of birth, ID band Patient awake    Reviewed: Allergy & Precautions, H&P , NPO status , Patient's Chart, lab work & pertinent test results  Airway Mallampati: II TM Distance: >3 FB Neck ROM: Full    Dental  (+) Teeth Intact and Dental Advisory Given   Pulmonary former smoker,  breath sounds clear to auscultation        Cardiovascular Rhythm:Regular Rate:Normal     Neuro/Psych    GI/Hepatic   Endo/Other    Renal/GU      Musculoskeletal   Abdominal   Peds  Hematology   Anesthesia Other Findings   Reproductive/Obstetrics                           Anesthesia Physical Anesthesia Plan  ASA: II  Anesthesia Plan: General   Post-op Pain Management:    Induction: Intravenous  Airway Management Planned: LMA  Additional Equipment:   Intra-op Plan:   Post-operative Plan:   Informed Consent: I have reviewed the patients History and Physical, chart, labs and discussed the procedure including the risks, benefits and alternatives for the proposed anesthesia with the patient or authorized representative who has indicated his/her understanding and acceptance.   Dental advisory given  Plan Discussed with: CRNA and Anesthesiologist  Anesthesia Plan Comments: (H/O smoking  Plan GA with LMA  Roberts Gaudy, MD)        Anesthesia Quick Evaluation

## 2013-11-14 NOTE — Anesthesia Postprocedure Evaluation (Signed)
  Anesthesia Post-op Note  Patient: [name]  Procedure(s) Performed: Procedure(s): ARTHROSCOPY LEFT ELBOW, SYNOVECTOMY, DEBRIDEMENT, REMOVAL OF LOOSE BODIES (Left) SHOULDER INJECTION (Left)  Patient Location: PACU  Anesthesia Type:General  Level of Consciousness: awake, alert  and oriented  Airway and Oxygen Therapy: Patient Spontanous Breathing  Post-op Pain: mild  Post-op Assessment: Post-op Vital signs reviewed, Patient's Cardiovascular Status Stable, Respiratory Function Stable, Patent Airway and Pain level controlled  Post-op Vital Signs: stable  Complications: No apparent anesthesia complications

## 2013-11-14 NOTE — Discharge Instructions (Signed)
Elbow Arthroscopy, Care After  Keep arm in sling until follow up appointment.  May change dressing on Sunday and apply band-aids.  May shower on Sunday, but do not soak incisions.  May use ice for up to 20 minutes at a time for pain and swelling.  Follow up appointment in our office in one week.      Refer to this sheet in the next few weeks. These instructions provide you with information on caring for yourself after your procedure. Your health care provider may also give you more specific instructions. Your treatment has been planned according to current medical practices, but problems sometimes occur. Call your health care provider if you have any problems or questions after your procedure. WHAT TO EXPECT AFTER THE PROCEDURE Elbows are often swollen, stiff, and sore following surgery. The recovery time will vary with the procedure done. Splints, casts or bandages will be worn for various amounts of time. HOME CARE INSTRUCTIONS   If physical therapy and exercises were prescribed by your surgeon, follow and perform them diligently.  Avoid gripping or lifting heavy objects.  It will be normal to be sore for a couple weeks following surgery. See your health care provider if soreness seems to be getting worse rather than better.  Only take over-the-counter or prescription medicines for pain, discomfort, or fever as directed by your health care provider.  Shower. Do not bathe.  Change dressings if necessary as directed.  You may resume normal diet and activities as directed or allowed.  Make an appointment to see your health care provider for removal of stitches or staples.  Applying an ice pack to the operative site may help with discomfort, keep swelling down, and speed up the healing process. To do this:  Put ice in a bag.  Place a towel between your skin and the bag.  Leave the ice on for 20 minutes, 3 4 times a day, for 2 3 days or as directed by your health care provider.  Do  range of motion exercises with the elbow as directed.  Keep your elbow elevated and wrapped to keep swelling down and to decrease pain. This also speeds healing. SEEK MEDICAL CARE IF:   There is redness, swelling, or increasing pain in the wound or joint.  Pus is coming from wound.  An unexplained fever develops.  You notice a foul smell coming from the wound or dressing.  There is a breaking open of the wound (edges not staying together) after sutures or tape has been removed. SEEK IMMEDIATE MEDICAL CARE IF:   You develop a rash.  You have difficulty breathing.  You have chest pain.  You develop any reaction or side effects to medicines given. Document Released: 05/05/2005 Document Revised: 06/18/2013 Document Reviewed: 05/15/2013 [facility] Patient Information 2014 [place], [place].   Post Anesthesia Home Care Instructions  Activity: Get plenty of rest for the remainder of the day. A responsible adult should stay with you for 24 hours following the procedure.  For the next 24 hours, DO NOT: -Drive a car -Paediatric nurse -Drink alcoholic beverages -Take any medication unless instructed by your physician -Make any legal decisions or sign important papers.  Meals: Start with liquid foods such as gelatin or soup. Progress to regular foods as tolerated. Avoid greasy, spicy, heavy foods. If nausea and/or vomiting occur, drink only clear liquids until the nausea and/or vomiting subsides. Call your physician if vomiting continues.  Special Instructions/Symptoms: Your throat may feel dry or sore from the  anesthesia or the breathing tube placed in your throat during surgery. If this causes discomfort, gargle with warm salt water. The discomfort should disappear within 24 hours.

## 2013-11-17 ENCOUNTER — Encounter (HOSPITAL_BASED_OUTPATIENT_CLINIC_OR_DEPARTMENT_OTHER): Payer: Self-pay | Admitting: Orthopedic Surgery

## 2013-11-17 NOTE — Op Note (Signed)
NAMEMAR, WALMER NO.:  [id number]  MEDICAL RECORD NO.:  [id number]  LOCATION:                               FACILITY:  [facility]  PHYSICIAN:  Ninetta Lights, M.D. DATE OF BIRTH:  July 26, 1956  DATE OF PROCEDURE:  11/14/2013 DATE OF DISCHARGE:  11/14/2013                              OPERATIVE REPORT   PREOPERATIVE DIAGNOSES: 1. Left elbow posttraumatic synovitis, question loose bodies,     posterior impingement. 2. Posttraumatic subacromial bursitis, left shoulder.  POSTOPERATIVE DIAGNOSIS: 1. Left elbow posttraumatic synovitis, anterior and posterior     compartments.  Some mild chondromalacia radial head with chondral     loose bodies.  No large osteochondral loose bodies.  Posterior     posttraumatic impingement with olecranon spurring. 2. Subacromial posttraumatic bursitis, left shoulder.  PROCEDURE: 1. Left elbow exam under anesthesia, arthroscopy.  Synovectomy     anterior posterior.  Chondroplasty anterior posterior.  Posterior     decompression with excision of olecranon spurs. 2. Subacromial injection, left shoulder under sterile technique.  SURGEON:  Ninetta Lights, M.D.  ASSISTANT:  Eula Listen, PA.  ANESTHESIA:  General.  BLOOD LOSS:  Minimal.  SPECIMENS:  None.  CULTURES:  None.  COMPLICATIONS:  None.  DRESSINGS:  Soft compressive.  TOURNIQUET TIME:  One hour.  PROCEDURE:  The patient was brought to the operating room, placed on the operating table in a supine position.  After an adequate anesthesia had been obtained, injected subacromially left shoulder under sterile technique.  I then placed a tourniquet in the upper aspect of the left arm.  Prepped and draped in usual sterile fashion.  Exsanguinated with elevation of Esmarch.  Tourniquet inflated to 250 mmHg.  Two portals, anterolateral and anteromedial.  Arthroscope introduced, elbow distended and inspected.  A lot of reactive synovitis debrided.  Some chondral changes  over the radial head debrided.  Although you could see what appeared to be a loose body on plain x-ray at the interval between the proximal radius, ulna, and humerus, I looked hard throughout that area both front and back, and there was no obvious loose body free in the joint.  Chondral debris removed.  The rest of the front of the elbow looked good.  Two posterior portals.  Reactive synovitis with posterior impingement.  Again, no large loose bodies seen.  Posterior compartment decompressed with synovectomy.  Sequential removal of olecranon spur obliterating all posterior impingement.  Full extension confirmed. Fluoroscopic guidance was used to confirm adequate decompression of the back.  Although I could still see one small fleck on the AP view, I had confirmed very thoroughly this is not loosen within the elbow. Instruments and fluid removed.  Portals injected with Marcaine, closed with nylon.  Sterile compressive dressing applied.  Tourniquet inflated and removed.  Anesthesia reversed.  Brought to the recovery room. Tolerated the surgery well.  No complications.     Ninetta Lights, M.D.     DFM/MEDQ  D:  11/14/2013  T:  11/15/2013  Job:  [id number]

## 2013-11-26 ENCOUNTER — Encounter: Payer: Self-pay | Admitting: Internal Medicine

## 2014-07-22 ENCOUNTER — Encounter: Payer: Self-pay | Admitting: Internal Medicine

## 2014-09-06 ENCOUNTER — Ambulatory Visit (INDEPENDENT_AMBULATORY_CARE_PROVIDER_SITE_OTHER): Payer: 59 | Admitting: Emergency Medicine

## 2014-09-06 VITALS — BP 118/66 | HR 88 | Temp 98.1°F | Resp 18 | Ht 69.0 in | Wt 192.0 lb

## 2014-09-06 DIAGNOSIS — M109 Gout, unspecified: Secondary | ICD-10-CM | POA: Insufficient documentation

## 2014-09-06 MED ORDER — COLCHICINE 0.6 MG PO TABS: ORAL_TABLET | ORAL | Status: DC

## 2014-09-06 MED ORDER — INDOMETHACIN 25 MG PO CAPS: 25.0000 mg | ORAL_CAPSULE | Freq: Three times a day (TID) | ORAL | Status: DC

## 2014-09-06 NOTE — Patient Instructions (Signed)

## 2014-09-06 NOTE — Progress Notes (Signed)
[facility] [address], [place] [address] [phone]  Date:  09/06/2014   Name:  Bryce Cameron   DOB:  04-21-1956   MRN:  [id number]  PCP:  Elsie Stain, MD    Chief Complaint: Gout   History of Present Illness:  Bryce Cameron is a 58 y.o. very pleasant male patient who presents with the following:  Pain in right great toe MTP joint toe since Friday.  Last gout attack years ago.   No history of injury Pain not responding to "cherry juice".  No diuretics or diet change No improvement with over the counter medications or other home remedies.  Denies other complaint or health concern today.   Patient Active Problem List   Diagnosis Date Noted  . Gout 09/06/2014  . HYPERLIPIDEMIA 06/10/2010  . GERD 06/10/2010  . COLONIC POLYPS, HX OF 06/10/2010    Past Medical History  Diagnosis Date  . DJD (degenerative joint disease)   . Wears glasses     Past Surgical History  Procedure Laterality Date  . Tonsillectomy    . Knee arthroplasty  2008    right  . Knee arthroscopy  2012    left  . Shoulder arthroscopy  2011    left  . Shoulder arthroscopy w/ rotator cuff repair  2008    right  . Colonoscopy    . Nasal polyp excision    . Elbow arthroscopy Left 11/14/2013    Procedure: ARTHROSCOPY LEFT ELBOW, SYNOVECTOMY, DEBRIDEMENT, REMOVAL OF LOOSE BODIES;  Surgeon: Ninetta Lights, MD;  Location: [facility];  Service: Orthopedics;  Laterality: Left;  . Shoulder injection Left 11/14/2013    Procedure: SHOULDER INJECTION;  Surgeon: Ninetta Lights, MD;  Location: [facility];  Service: Orthopedics;  Laterality: Left;    History  Substance Use Topics  . Smoking status: Former Smoker    Quit date: 11/11/2012  . Smokeless tobacco: Not on file  . Alcohol Use: Yes    No family history on file.  Allergies  Allergen Reactions  . Varenicline Tartrate     REACTION: intolerant but not allergic    Medication list has been  reviewed and updated.  Current Outpatient Prescriptions on File Prior to Visit  Medication Sig Dispense Refill  . oxyCODONE-acetaminophen (ROXICET) 5-325 MG per tablet Take 1 tablet by mouth every 4 (four) hours as needed for severe pain. 60 tablet 0   No current facility-administered medications on file prior to visit.    Review of Systems:  As per HPI, otherwise negative.    Physical Examination: Filed Vitals:   09/06/14 1117  BP: 118/66  Pulse: 88  Temp: 98.1 F (36.7 C)  Resp: 18   Filed Vitals:   09/06/14 1117  Height: 5\' 9"  (1.753 m)  Weight: 192 lb (87.091 kg)   Body mass index is 28.34 kg/(m^2). Ideal Body Weight: Weight in (lb) to have BMI = 25: 168.9   GEN: WDWN, NAD, Non-toxic, Alert & Oriented x 3 HEENT: Atraumatic, Normocephalic.  Ears and Nose: No external deformity. EXTR: No clubbing/cyanosis/edema NEURO: Normal gait.  PSYCH: Normally interactive. Conversant. Not depressed or anxious appearing.  Calm demeanor.  RIGHT foot:  Exquisitely tender and erythematous swollen right great toe MTP joint   Assessment and Plan: Exacerbation gout colcrys Indocin  Signed,  Ellison Carwin, MD

## 2014-10-30 DIAGNOSIS — M109 Gout, unspecified: Secondary | ICD-10-CM

## 2014-10-30 HISTORY — PX: COLONOSCOPY: SHX174

## 2014-10-30 HISTORY — DX: Gout, unspecified: M10.9

## 2015-02-10 ENCOUNTER — Ambulatory Visit (INDEPENDENT_AMBULATORY_CARE_PROVIDER_SITE_OTHER): Payer: Managed Care, Other (non HMO) | Admitting: Family Medicine

## 2015-02-10 VITALS — BP 110/64 | HR 87 | Temp 97.7°F | Resp 16 | Ht 69.0 in | Wt 196.0 lb

## 2015-02-10 DIAGNOSIS — M1000 Idiopathic gout, unspecified site: Secondary | ICD-10-CM

## 2015-02-10 MED ORDER — FOLIC ACID 1 MG PO TABS: 1.0000 mg | ORAL_TABLET | Freq: Every day | ORAL | Status: DC

## 2015-02-10 MED ORDER — PREDNISONE 20 MG PO TABS: 20.0000 mg | ORAL_TABLET | Freq: Every day | ORAL | Status: DC

## 2015-02-10 NOTE — Patient Instructions (Signed)

## 2015-02-10 NOTE — Progress Notes (Signed)
Patient ID: Bryce Cameron, male   DOB: April 30, 1956, 59 y.o.   MRN: [id number]   This chart was scribed for Robyn Haber, MD by [facility], medical scribe at [facility].The patient was seen in exam room 02 and the patient's care was started at 7:02 PM.  Patient ID: Bryce Cameron MRN: [id number], DOB: 09-Jul-1956, 59 y.o. Date of Encounter: 02/10/2015  Primary Physician: Elsie Stain, MD  Chief Complaint:  Chief Complaint  Patient presents with   Gout   Medication Refill   HPI:  Bryce Cameron is a 59 y.o. male who presents to [facility] complaining of a gout flare up in his right greater toe. Pt states this is his 4 th of 5th flare up. Walking worsens the pain, but  He is currently taking colchicine and is still having gout attacks. His last attack was 2 weeks ago. Pt states he gets a tingling sensation before the gout attack. He is unable to wear his regular shoes, play soccer and play golf due to the pain. He quit smoking 3 years ago and gained 30 pounds since quitting. Pt needs to schedule a physical exam and a colonoscopy. He had an ultrasound of his abdomen which showed a fatty liver. He works for [employer].  Past Medical History  Diagnosis Date   DJD (degenerative joint disease)    Wears glasses     Home Meds: Prior to Admission medications   Medication Sig Start Date End Date Taking? Authorizing Provider  colchicine 0.6 MG tablet 2 now, one in one hour.  Tomorrow one daily Patient not taking: Reported on 02/10/2015 09/06/14   Roselee Culver, MD   Allergies:  Allergies  Allergen Reactions   Varenicline Tartrate     REACTION: intolerant but not allergic   History   Social History   Marital Status: Divorced    Spouse Name: N/A   Number of Children: N/A   Years of Education: N/A   Occupational History   Not on file.   Social History Main Topics   Smoking status: Former Smoker    Quit date: 11/11/2012   Smokeless  tobacco: Not on file   Alcohol Use: Yes   Drug Use: No   Sexual Activity: Not on file   Other Topics Concern   Not on file   Social History Narrative    Review of Systems: Constitutional: negative for chills, fever, night sweats, weight changes, or fatigue  HEENT: negative for vision changes, hearing loss, congestion, rhinorrhea, ST, epistaxis, or sinus pressure Cardiovascular: negative for chest pain or palpitations Respiratory: negative for hemoptysis, wheezing, shortness of breath, or cough Abdominal: negative for abdominal pain, nausea, vomiting, diarrhea, or constipation Msk: Positive for arthralgias, gait problem. Dermatological: negative for rash Neurologic: negative for headache, dizziness, or syncope All other systems reviewed and are otherwise negative with the exception to those above and in the HPI.  Physical Exam: Blood pressure 110/64, pulse 87, temperature 97.7 F (36.5 C), temperature source Oral, resp. rate 16, height 5\' 9"  (1.753 m), weight 196 lb (88.905 kg), SpO2 97 %., Body mass index is 28.93 kg/(m^2). General: Well developed, well nourished, in no acute distress. Head: Normocephalic, atraumatic, eyes without discharge, sclera non-icteric, nares are without discharge. Bilateral auditory canals clear, TM's are without perforation, pearly grey and translucent with reflective cone of light bilaterally. Oral cavity moist, posterior pharynx without exudate, erythema, peritonsillar abscess, or post nasal drip.  Neck: Supple. No thyromegaly. Full ROM.  No lymphadenopathy. Lungs: Clear bilaterally to auscultation without wheezes, rales, or rhonchi. Breathing is unlabored. Heart: RRR with S1 S2. No murmurs, rubs, or gallops appreciated. Abdomen: Soft, non-tender, non-distended with normoactive bowel sounds. No hepatomegaly. No rebound/guarding. No obvious abdominal masses. Msk:  Strength and tone normal for age. Extremities/Skin: Warm and dry. No clubbing or cyanosis.  No edema. No rashes or suspicious lesions. Tender right great toe. Neuro: Alert and oriented X 3. Moves all extremities spontaneously. Gait is normal. CNII-XII grossly in tact. Psych:  Responds to questions appropriately with a normal affect.    ASSESSMENT AND PLAN:  59 y.o. year old male with gout which has not been well controlled with allopurinol. He also needs a physical and preventive care measures. This chart was scribed in my presence and reviewed by me personally.    ICD-9-CM ICD-10-CM   1. Idiopathic gout, unspecified chronicity, unspecified site 274.9 M10.00 Ambulatory referral to Rheumatology     predniSONE (DELTASONE) 20 MG tablet     folic acid (FOLVITE) 1 MG tablet     Ambulatory referral to Gastroenterology     Signed, Robyn Haber, MD  Signed, Robyn Haber, MD 02/10/2015 7:02 PM

## 2015-02-15 ENCOUNTER — Encounter: Payer: Self-pay | Admitting: Internal Medicine

## 2015-03-10 ENCOUNTER — Telehealth: Payer: Self-pay

## 2015-03-10 NOTE — Telephone Encounter (Signed)
ASSESSMENT AND PLAN:  59 y.o. year old male with gout which has not been well controlled with allopurinol. He also needs a physical and preventive care measures. This chart was scribed in my presence and reviewed by me personally.    ICD-9-CM ICD-10-CM   1. Idiopathic gout, unspecified chronicity, unspecified site 274.9 M10.00 Ambulatory referral to Rheumatology     predniSONE (DELTASONE) 20 MG tablet     folic acid (FOLVITE) 1 MG tablet     Ambulatory referral to Gastroenterology

## 2015-03-10 NOTE — Telephone Encounter (Signed)
°  Pt states the medication he was given for his gout isn't helping at all and he is out of all his meds Please call [phone]     [facility] [place]

## 2015-03-10 NOTE — Telephone Encounter (Signed)
If the medication did not work, why wait 3+ weeks to let us know?  He is supposed to have a rheumatology appt.  If he is unhapppy with current status, please return for reevaluatio.

## 2015-03-11 NOTE — Telephone Encounter (Signed)
Spoke with pt, advised message  From Dr. Carlean Jews. Pt understood.

## 2015-03-16 ENCOUNTER — Encounter: Payer: Self-pay | Admitting: Internal Medicine

## 2015-04-15 ENCOUNTER — Encounter (INDEPENDENT_AMBULATORY_CARE_PROVIDER_SITE_OTHER): Payer: Self-pay

## 2015-04-15 ENCOUNTER — Ambulatory Visit (AMBULATORY_SURGERY_CENTER): Payer: Self-pay | Admitting: *Deleted

## 2015-04-15 VITALS — Ht 69.0 in | Wt 186.0 lb

## 2015-04-15 DIAGNOSIS — Z86010 Personal history of colonic polyps: Secondary | ICD-10-CM

## 2015-04-15 NOTE — Progress Notes (Signed)
No home 02 No post op anesthesia complications No diet drugs

## 2015-04-30 ENCOUNTER — Encounter: Payer: Self-pay | Admitting: Internal Medicine

## 2015-04-30 ENCOUNTER — Ambulatory Visit (AMBULATORY_SURGERY_CENTER): Payer: Managed Care, Other (non HMO) | Admitting: Internal Medicine

## 2015-04-30 VITALS — BP 107/77 | HR 78 | Temp 97.2°F | Resp 11 | Ht 69.0 in | Wt 196.0 lb

## 2015-04-30 DIAGNOSIS — D125 Benign neoplasm of sigmoid colon: Secondary | ICD-10-CM

## 2015-04-30 DIAGNOSIS — D122 Benign neoplasm of ascending colon: Secondary | ICD-10-CM

## 2015-04-30 DIAGNOSIS — Z86010 Personal history of colonic polyps: Secondary | ICD-10-CM

## 2015-04-30 DIAGNOSIS — K2270 Barrett's esophagus without dysplasia: Secondary | ICD-10-CM

## 2015-04-30 DIAGNOSIS — D120 Benign neoplasm of cecum: Secondary | ICD-10-CM

## 2015-04-30 DIAGNOSIS — D124 Benign neoplasm of descending colon: Secondary | ICD-10-CM | POA: Diagnosis not present

## 2015-04-30 MED ORDER — SODIUM CHLORIDE 0.9 % IV SOLN: 500.0000 mL | INTRAVENOUS | Status: DC

## 2015-04-30 NOTE — Progress Notes (Signed)
To recovery, report to Smith, RN, VSS 

## 2015-04-30 NOTE — Patient Instructions (Addendum)
I found and removed 5 small polyps. I will let you know pathology results and when to have another routine colonoscopy by mail.  You should also have follow-up of the Barrett's esophagus arranged - I will discuss with you after I get these results back.  If you want to check with your insurance company the CPT code for upper endoscopy and biopsy would be 43239 and diagnosis is barrett's esophagus.  I appreciate the opportunity to care for you. Gatha Mayer, MD, FACG YOU HAD AN ENDOSCOPIC PROCEDURE TODAY AT [facility]:   Refer to the procedure report that was given to you for any specific questions about what was found during the examination.  If the procedure report does not answer your questions, please call your gastroenterologist to clarify.  If you requested that your care partner not be given the details of your procedure findings, then the procedure report has been included in a sealed envelope for you to review at your convenience later.  YOU SHOULD EXPECT: Some feelings of bloating in the abdomen. Passage of more gas than usual.  Walking can help get rid of the air that was put into your GI tract during the procedure and reduce the bloating. If you had a lower endoscopy (such as a colonoscopy or flexible sigmoidoscopy) you may notice spotting of blood in your stool or on the toilet paper. If you underwent a bowel prep for your procedure, you may not have a normal bowel movement for a few days.  Please Note:  You might notice some irritation and congestion in your nose or some drainage.  This is from the oxygen used during your procedure.  There is no need for concern and it should clear up in a day or so.  SYMPTOMS TO REPORT IMMEDIATELY:   Following lower endoscopy (colonoscopy or flexible sigmoidoscopy):  Excessive amounts of blood in the stool  Significant tenderness or worsening of abdominal pains  Swelling of the abdomen that is new, acute  Fever of 100F  or higher    For urgent or emergent issues, a gastroenterologist can be reached at any hour by calling [phone].   DIET: Your first meal following the procedure should be a small meal and then it is ok to progress to your normal diet. Heavy or fried foods are harder to digest and may make you feel nauseous or bloated.  Likewise, meals heavy in dairy and vegetables can increase bloating.  Drink plenty of fluids but you should avoid alcoholic beverages for 24 hours.  ACTIVITY:  You should plan to take it easy for the rest of today and you should NOT DRIVE or use heavy machinery until tomorrow (because of the sedation medicines used during the test).    FOLLOW UP: Our staff will call the number listed on your records the next business day following your procedure to check on you and address any questions or concerns that you may have regarding the information given to you following your procedure. If we do not reach you, we will leave a message.  However, if you are feeling well and you are not experiencing any problems, there is no need to return our call.  We will assume that you have returned to your regular daily activities without incident.  If any biopsies were taken you will be contacted by phone or by letter within the next 1-3 weeks.  Please call us at [phone] if you have not heard about the biopsies in  3 weeks.    SIGNATURES/CONFIDENTIALITY: You and/or your care partner have signed paperwork which will be entered into your electronic medical record.  These signatures attest to the fact that that the information above on your After Visit Summary has been reviewed and is understood.  Full responsibility of the confidentiality of this discharge information lies with you and/or your care-partner.

## 2015-04-30 NOTE — Op Note (Signed)
[place]  [address] [place], [id number]   COLONOSCOPY PROCEDURE REPORT  PATIENT: Bryce Cameron, Bryce Cameron  MR#: [id number] BIRTHDATE: 05-Jul-1956 , 59  yrs. old GENDER: male ENDOSCOPIST: Gatha Mayer, MD, [facility] PROCEDURE DATE:  04/30/2015 PROCEDURE:   Colonoscopy with biopsy and Colonoscopy with snare polypectomy First Screening Colonoscopy - Avg.  risk and is 50 yrs.  old or older - No.  Prior Negative Screening - Now for repeat screening. N/A  History of Adenoma - Now for follow-up colonoscopy & has been > or = to 3 yrs.  Yes hx of adenoma.  Has been 3 or more years since last colonoscopy.  Polyps removed today? Yes ASA CLASS:   Class II INDICATIONS:Surveillance due to prior colonic neoplasia and PH Colon Adenoma. MEDICATIONS: Propofol 400 mg IV and Monitored anesthesia care  DESCRIPTION OF PROCEDURE:   After the risks benefits and alternatives of the procedure were thoroughly explained, informed consent was obtained.  The digital rectal exam revealed no abnormalities of the rectum, revealed the prostate was not enlarged, and revealed no prostatic nodules.   The [id number]  endoscope was introduced through the anus and advanced to the cecum, which was identified by both the appendix and ileocecal valve. No adverse events experienced.   The quality of the prep was adequate (MiraLax was used)  The instrument was then slowly withdrawn as the colon was fully examined. Estimated blood loss is zero unless otherwise noted in this procedure report.  COLON FINDINGS: Five sessile polyps ranging from 2 to 46mm in size were found at the cecum, in the ascending colon, and descending colon.  Polypectomies were performed with cold forceps and with a cold snare.  The resection was complete, the polyp tissue was completely retrieved and sent to histology.   There was diverticulosis noted in the sigmoid colon.   The examination was otherwise normal.  Retroflexed views  revealed no abnormalities. The time to cecum = 1.8 Withdrawal time = 25.0   The scope was withdrawn and the procedure completed. COMPLICATIONS: There were no immediate complications.  ENDOSCOPIC IMPRESSION: 1.   Five sessile polyps ranging from 2 to 5mm in size were found at the cecum, in the ascending colon, and descending colon; polypectomies were performed with cold forceps and with a cold snare 2.   Diverticulosis was noted in the sigmoid colon 3.   The examination was otherwise normal  RECOMMENDATIONS: 1.  Timing of repeat colonoscopy will be determined by pathology findings. 2.  Better fiber restriction next time 3.  Has hx of Barrett's and has not had f/u EGD - will review with him after colon pathology back  eSigned:  Gatha Mayer, MD, [facility] 04/30/2015 9:21 AM  cc: The Patient

## 2015-04-30 NOTE — Progress Notes (Signed)
Called to room to assist during endoscopic procedure.  Patient ID and intended procedure confirmed with present staff. Received instructions for my participation in the procedure from the performing physician.  

## 2015-05-04 ENCOUNTER — Telehealth: Payer: Self-pay

## 2015-05-04 NOTE — Telephone Encounter (Signed)
  Follow up Call-  Call back number 04/30/2015  Post procedure Call Back phone  # [phone]  Permission to leave phone message Yes     Patient questions:  Do you have a fever, pain , or abdominal swelling? No. Pain Score  0 *  Have you tolerated food without any problems? Yes.    Have you been able to return to your normal activities? Yes.    Do you have any questions about your discharge instructions: Diet   No. Medications  No. Follow up visit  No.  Do you have questions or concerns about your Care? No.  Actions: * If pain score is 4 or above: No action needed, pain <4.

## 2015-05-07 ENCOUNTER — Telehealth: Payer: Self-pay | Admitting: Internal Medicine

## 2015-05-07 NOTE — Telephone Encounter (Signed)
Without other GI sxs does not seem likely to be related to the procedure and assuming IV site ok also Would ask him to see PCP or urgent care for this please  Let me know

## 2015-05-07 NOTE — Telephone Encounter (Signed)
Patient notified of recommendations. He states the IV site is fine.

## 2015-05-07 NOTE — Telephone Encounter (Signed)
Patient had a colonoscopy on 04/30/15. He states he is running a temperature of 101-101.3 in evening. He is taking Advil in AM and PM. His temperature this AM was 98.9. He also reports a headache and states "my vision is off."  No pain or other symptoms. Please, advise.

## 2015-05-10 NOTE — Progress Notes (Signed)
Quick Note:  3 adenomas and an ssp Repeat colonoscopy 3 yrs  Call form office with results and also he is overdue for a Barrett's surveillance egd which I recommend he schedule  [facility] - enter colon recall for 04/2018 ______

## 2017-11-28 DIAGNOSIS — M109 Gout, unspecified: Secondary | ICD-10-CM | POA: Diagnosis not present

## 2017-11-28 DIAGNOSIS — M1000 Idiopathic gout, unspecified site: Secondary | ICD-10-CM | POA: Diagnosis not present

## 2017-11-28 DIAGNOSIS — I10 Essential (primary) hypertension: Secondary | ICD-10-CM | POA: Diagnosis not present

## 2018-01-08 DIAGNOSIS — Z01118 Encounter for examination of ears and hearing with other abnormal findings: Secondary | ICD-10-CM | POA: Diagnosis not present

## 2018-01-08 DIAGNOSIS — R05 Cough: Secondary | ICD-10-CM | POA: Diagnosis not present

## 2018-01-08 DIAGNOSIS — Z131 Encounter for screening for diabetes mellitus: Secondary | ICD-10-CM | POA: Diagnosis not present

## 2018-01-08 DIAGNOSIS — K219 Gastro-esophageal reflux disease without esophagitis: Secondary | ICD-10-CM | POA: Diagnosis not present

## 2018-01-08 DIAGNOSIS — Z136 Encounter for screening for cardiovascular disorders: Secondary | ICD-10-CM | POA: Diagnosis not present

## 2018-01-08 DIAGNOSIS — M109 Gout, unspecified: Secondary | ICD-10-CM | POA: Diagnosis not present

## 2018-01-08 DIAGNOSIS — Z0000 Encounter for general adult medical examination without abnormal findings: Secondary | ICD-10-CM | POA: Diagnosis not present

## 2018-04-09 DIAGNOSIS — K219 Gastro-esophageal reflux disease without esophagitis: Secondary | ICD-10-CM | POA: Diagnosis not present

## 2018-04-09 DIAGNOSIS — Z01118 Encounter for examination of ears and hearing with other abnormal findings: Secondary | ICD-10-CM | POA: Diagnosis not present

## 2018-04-09 DIAGNOSIS — M109 Gout, unspecified: Secondary | ICD-10-CM | POA: Diagnosis not present

## 2018-04-09 DIAGNOSIS — Z131 Encounter for screening for diabetes mellitus: Secondary | ICD-10-CM | POA: Diagnosis not present

## 2018-04-09 DIAGNOSIS — Z0000 Encounter for general adult medical examination without abnormal findings: Secondary | ICD-10-CM | POA: Diagnosis not present

## 2018-04-09 DIAGNOSIS — Z136 Encounter for screening for cardiovascular disorders: Secondary | ICD-10-CM | POA: Diagnosis not present

## 2018-05-28 DIAGNOSIS — R55 Syncope and collapse: Secondary | ICD-10-CM | POA: Diagnosis not present

## 2018-05-28 DIAGNOSIS — K219 Gastro-esophageal reflux disease without esophagitis: Secondary | ICD-10-CM | POA: Diagnosis not present

## 2018-05-28 DIAGNOSIS — E782 Mixed hyperlipidemia: Secondary | ICD-10-CM | POA: Diagnosis not present

## 2018-06-07 DIAGNOSIS — Z0189 Encounter for other specified special examinations: Secondary | ICD-10-CM | POA: Diagnosis not present

## 2018-06-07 DIAGNOSIS — R55 Syncope and collapse: Secondary | ICD-10-CM | POA: Diagnosis not present

## 2018-06-07 DIAGNOSIS — E782 Mixed hyperlipidemia: Secondary | ICD-10-CM | POA: Diagnosis not present

## 2018-08-26 ENCOUNTER — Encounter: Payer: Self-pay | Admitting: Internal Medicine

## 2019-11-25 ENCOUNTER — Telehealth: Payer: Self-pay | Admitting: Family Medicine

## 2019-11-25 NOTE — Telephone Encounter (Signed)
Pt called to schedule a new patient appt. Are you okay continuing seeing him as your patient even though it has been so long or does he need to re-establish with another provider?

## 2019-11-25 NOTE — Telephone Encounter (Signed)
I would see if anyone else has a slot for a new patient given my current patient panel.  Thanks.

## 2020-01-02 ENCOUNTER — Ambulatory Visit: Payer: Self-pay | Attending: Internal Medicine

## 2020-01-02 DIAGNOSIS — Z23 Encounter for immunization: Secondary | ICD-10-CM | POA: Insufficient documentation

## 2020-01-02 NOTE — Progress Notes (Signed)
   Covid-19 Vaccination Clinic  Name:  Bryce Cameron    MRN: [id number] DOB: [birth year]  01/02/2020  [name] was observed post Covid-19 immunization for 15 minutes without incident. He was provided with Vaccine Information Sheet and instruction to access the V-Safe system.   [name] was instructed to call 911 with any severe reactions post vaccine: Marland Kitchen Difficulty breathing  . Swelling of face and throat  . A fast heartbeat  . A bad rash all over body  . Dizziness and weakness   Immunizations Administered    Name Date Dose VIS Date Route   Pfizer COVID-19 Vaccine 01/02/2020  8:46 AM 0.3 mL 10/10/2019 Intramuscular   Manufacturer: [place]   Lot: [id number]   [place]: [id number]

## 2020-01-06 ENCOUNTER — Other Ambulatory Visit: Payer: Self-pay

## 2020-01-06 ENCOUNTER — Encounter: Payer: Self-pay | Admitting: Family Medicine

## 2020-01-06 ENCOUNTER — Ambulatory Visit (INDEPENDENT_AMBULATORY_CARE_PROVIDER_SITE_OTHER): Payer: Medicare PPO | Admitting: Family Medicine

## 2020-01-06 VITALS — BP 114/72 | HR 95 | Temp 98.4°F | Resp 10 | Ht 68.5 in | Wt 185.8 lb

## 2020-01-06 DIAGNOSIS — Z136 Encounter for screening for cardiovascular disorders: Secondary | ICD-10-CM | POA: Diagnosis not present

## 2020-01-06 DIAGNOSIS — R399 Unspecified symptoms and signs involving the genitourinary system: Secondary | ICD-10-CM

## 2020-01-06 DIAGNOSIS — Z125 Encounter for screening for malignant neoplasm of prostate: Secondary | ICD-10-CM | POA: Diagnosis not present

## 2020-01-06 DIAGNOSIS — N529 Male erectile dysfunction, unspecified: Secondary | ICD-10-CM

## 2020-01-06 DIAGNOSIS — B078 Other viral warts: Secondary | ICD-10-CM | POA: Insufficient documentation

## 2020-01-06 DIAGNOSIS — Z114 Encounter for screening for human immunodeficiency virus [HIV]: Secondary | ICD-10-CM | POA: Diagnosis not present

## 2020-01-06 DIAGNOSIS — Z1159 Encounter for screening for other viral diseases: Secondary | ICD-10-CM

## 2020-01-06 DIAGNOSIS — Z86010 Personal history of colonic polyps: Secondary | ICD-10-CM

## 2020-01-06 DIAGNOSIS — Z113 Encounter for screening for infections with a predominantly sexual mode of transmission: Secondary | ICD-10-CM

## 2020-01-06 LAB — COMPREHENSIVE METABOLIC PANEL
ALT: 33 U/L (ref 0–53)
AST: 22 U/L (ref 0–37)
Albumin: 4.5 g/dL (ref 3.5–5.2)
Alkaline Phosphatase: 84 U/L (ref 39–117)
BUN: 17 mg/dL (ref 6–23)
CO2: 28 mEq/L (ref 19–32)
Calcium: 10.1 mg/dL (ref 8.4–10.5)
Chloride: 102 mEq/L (ref 96–112)
Creatinine, Ser: 0.79 mg/dL (ref 0.40–1.50)
GFR: 98.17 mL/min (ref >60.00)
Glucose, Bld: 93 mg/dL (ref 70–99)
Potassium: 4.8 mEq/L (ref 3.5–5.1)
Sodium: 137 mEq/L (ref 135–145)
Total Bilirubin: 0.5 mg/dL (ref 0.2–1.2)
Total Protein: 7.5 g/dL (ref 6.0–8.3)

## 2020-01-06 LAB — LIPID PANEL
Cholesterol: 210 mg/dL — ABNORMAL HIGH (ref 0–200)
HDL: 33.8 mg/dL — ABNORMAL LOW (ref >39.00)
LDL Cholesterol: 138 mg/dL — ABNORMAL HIGH (ref 0–99)
NonHDL: 176.55
Total CHOL/HDL Ratio: 6
Triglycerides: 195 mg/dL — ABNORMAL HIGH (ref 0.0–149.0)
VLDL: 39 mg/dL (ref 0.0–40.0)

## 2020-01-06 NOTE — Assessment & Plan Note (Signed)
Failed viagra. Discussed cialis. Not interested at this time. He will reach out if he becomes interested again.

## 2020-01-06 NOTE — Assessment & Plan Note (Signed)
Encouraged patient to return for follow-up. He is overdue

## 2020-01-06 NOTE — Progress Notes (Signed)
Subjective:     Bryce Cameron is a 64 y.o. male presenting for Establish Care (last previous PCP with [facility] in [place]), Warts (on left arm and both knees.), and Discuss prostate (what is normal and what is not. Labs)     HPI  #Bladder/urination concern - started in the last year - has been less active in general in the last year - things it started with pain in the tailbone and numbness in the legs - getting buttock pain from driving - getting up 1-2 times at night depending on what he drank during the day - will get pressure in the tip of his penis when he has to urinate - no burning sensation  #erectile dysfunction - issues sustaining erection - no cp or sob with activity - erection issues 1-2 years ago - tried Viagra w/ side effects - did not try other medications    Review of Systems   Social History   Tobacco Use  Smoking Status Former Smoker  . Packs/day: 0.50  . Years: 25.00  . Pack years: 12.50  . Types: Cigarettes  . Quit date: 11/12/2011  . Years since quitting: 8.1  Smokeless Tobacco Never Used        Objective:    BP Readings from Last 3 Encounters:  01/06/20 114/72  04/30/15 107/77  02/10/15 110/64   Wt Readings from Last 3 Encounters:  01/06/20 185 lb 12 oz (84.3 kg)  04/30/15 196 lb (88.9 kg)  04/15/15 186 lb (84.4 kg)    BP 114/72   Pulse 95   Temp 98.4 F (36.9 C)   Resp 10   Ht 5' 8.5" (1.74 m)   Wt 185 lb 12 oz (84.3 kg)   BMI 27.83 kg/m    Physical Exam Constitutional:      Appearance: Normal appearance. He is not ill-appearing or diaphoretic.  HENT:     Right Ear: External ear normal.     Left Ear: External ear normal.     Nose: Nose normal.  Eyes:     General: No scleral icterus.    Extraocular Movements: Extraocular movements intact.     Conjunctiva/sclera: Conjunctivae normal.  Cardiovascular:     Rate and Rhythm: Normal rate and regular rhythm.     Heart sounds: No murmur.  Pulmonary:     Effort:  Pulmonary effort is normal. No respiratory distress.     Breath sounds: Normal breath sounds. No wheezing.  Musculoskeletal:     Cervical back: Neck supple.  Skin:    General: Skin is warm and dry.     Comments: Several verrucous lesions - 3 on the right knee, 1 on the left knee, and 1 on the left posterior arm proximal to the elbow.   Neurological:     Mental Status: He is alert. Mental status is at baseline.  Psychiatric:        Mood and Affect: Mood normal.           Assessment & Plan:   Problem List Items Addressed This Visit      Other   Hx of adenomatous polyp of colon    Encouraged patient to return for follow-up. He is overdue      Common wart - Primary    See procedure note below. Pt tolerated procedure well. Return if lesions return      Erectile dysfunction    Failed viagra. Discussed cialis. Not interested at this time. He will reach out if he becomes  interested again.       Relevant Orders   Comprehensive metabolic panel (Completed)   Lower urinary tract symptoms    Discussed that he lacks common symptoms of BPH. Not sure what to make of the pressure he feels in the lower penis when he needs to urinate but without difficulty emptying, frequency, burning, nocturia will get PSA and monitor. Will also get STI testing.        Other Visit Diagnoses    Routine screening for STI (sexually transmitted infection)       Relevant Orders   C. trachomatis/N. gonorrhoeae RNA   Encounter for special screening examination for cardiovascular disorder       Relevant Orders   Lipid panel (Completed)   Need for hepatitis C screening test       Relevant Orders   Hepatitis C antibody   Encounter for screening for HIV       Relevant Orders   HIV Antibody (routine testing w rflx)   Prostate cancer screening       Relevant Orders   PSA, Total with Reflex to PSA, Free     Procedure: Liquid Nitrogen for warts  Discussed risk benefits of the procedure - including risk  of blistering, risk of incomplete removal, and risk of infection.   Liquid nitrogen was applied to skin areas of warts (Right knee - 3 lesions, left knee, and left elbow) x 3 rounds with adequate freezing and thawing cycles.   Pt was advised to watch for infection and return if incomplete removal  No follow-ups on file.  Lesleigh Noe, MD

## 2020-01-06 NOTE — Assessment & Plan Note (Signed)
Discussed that he lacks common symptoms of BPH. Not sure what to make of the pressure he feels in the lower penis when he needs to urinate but without difficulty emptying, frequency, burning, nocturia will get PSA and monitor. Will also get STI testing.

## 2020-01-06 NOTE — Assessment & Plan Note (Signed)
See procedure note below. Pt tolerated procedure well. Return if lesions return

## 2020-01-06 NOTE — Patient Instructions (Addendum)
Liquid Nitrogen - the skin may blister - watch for redness, fever, chills, worsening pain -- these may be signs of infection - return in 3 weeks if the warts return     Tadalafil tablets (Cialis) What is this medicine? TADALAFIL (tah DA la fil) is used to treat erection problems in men. It is also used for enlargement of the prostate gland in men, a condition called benign prostatic hyperplasia or BPH. This medicine improves urine flow and reduces BPH symptoms. This medicine can also treat both erection problems and BPH when they occur together. This medicine may be used for other purposes; ask your health care provider or pharmacist if you have questions. COMMON BRAND NAME(S): Kathaleen Bury, Cialis What should I tell my health care provider before I take this medicine? They need to know if you have any of these conditions:  bleeding disorders  eye or vision problems, including a rare inherited eye disease called retinitis pigmentosa  anatomical deformation of the penis, Peyronie's disease, or history of priapism (painful and prolonged erection)  heart disease, angina, a history of heart attack, irregular heart beats, or other heart problems  high or low blood pressure  history of blood diseases, like sickle cell anemia or leukemia  history of stomach bleeding  kidney disease  liver disease  stroke  an unusual or allergic reaction to tadalafil, other medicines, foods, dyes, or preservatives  pregnant or trying to get pregnant  breast-feeding How should I use this medicine? Take this medicine by mouth with a glass of water. Follow the directions on the prescription label. You may take this medicine with or without meals. When this medicine is used for erection problems, your doctor may prescribe it to be taken once daily or as needed. If you are taking the medicine as needed, you may be able to have sexual activity 30 minutes after taking it and for up to 36 hours after  taking it. Whether you are taking the medicine as needed or once daily, you should not take more than one dose per day. If you are taking this medicine for symptoms of benign prostatic hyperplasia (BPH) or to treat both BPH and an erection problem, take the dose once daily at about the same time each day. Do not take your medicine more often than directed. Talk to your pediatrician regarding the use of this medicine in children. Special care may be needed. Overdosage: If you think you have taken too much of this medicine contact a poison control center or emergency room at once. NOTE: This medicine is only for you. Do not share this medicine with others. What if I miss a dose? If you are taking this medicine as needed for erection problems, this does not apply. If you miss a dose while taking this medicine once daily for an erection problem, benign prostatic hyperplasia, or both, take it as soon as you remember, but do not take more than one dose per day. What may interact with this medicine? Do not take this medicine with any of the following medications:  nitrates like amyl nitrite, isosorbide dinitrate, isosorbide mononitrate, nitroglycerin  other medicines for erectile dysfunction like avanafil, sildenafil, vardenafil  other tadalafil products (Adcirca)  riociguat This medicine may also interact with the following medications:  certain drugs for high blood pressure  certain drugs for the treatment of HIV infection or AIDS  certain drugs used for fungal or yeast infections, like fluconazole, itraconazole, ketoconazole, and voriconazole  certain drugs used for seizures  like carbamazepine, phenytoin, and phenobarbital  grapefruit juice  macrolide antibiotics like clarithromycin, erythromycin, troleandomycin  medicines for prostate problems  rifabutin, rifampin or rifapentine This list may not describe all possible interactions. Give your health care provider a list of all the  medicines, herbs, non-prescription drugs, or dietary supplements you use. Also tell them if you smoke, drink alcohol, or use illegal drugs. Some items may interact with your medicine. What should I watch for while using this medicine? If you notice any changes in your vision while taking this drug, call your doctor or health care professional as soon as possible. Stop using this medicine and call your health care provider right away if you have a loss of sight in one or both eyes. Contact your doctor or health care professional right away if the erection lasts longer than 4 hours or if it becomes painful. This may be a sign of serious problem and must be treated right away to prevent permanent damage. If you experience symptoms of nausea, dizziness, chest pain or arm pain upon initiation of sexual activity after taking this medicine, you should refrain from further activity and call your doctor or health care professional as soon as possible. Do not drink alcohol to excess (examples, 5 glasses of wine or 5 shots of whiskey) when taking this medicine. When taken in excess, alcohol can increase your chances of getting a headache or getting dizzy, increasing your heart rate or lowering your blood pressure. Using this medicine does not protect you or your partner against HIV infection (the virus that causes AIDS) or other sexually transmitted diseases. What side effects may I notice from receiving this medicine? Side effects that you should report to your doctor or health care professional as soon as possible:  allergic reactions like skin rash, itching or hives, swelling of the face, lips, or tongue  breathing problems  changes in hearing  changes in vision  chest pain  fast, irregular heartbeat  prolonged or painful erection  seizures Side effects that usually do not require medical attention (report to your doctor or health care professional if they continue or are bothersome):  back  pain  dizziness  flushing  headache  indigestion  muscle aches  nausea  stuffy or runny nose This list may not describe all possible side effects. Call your doctor for medical advice about side effects. You may report side effects to FDA at 1-800-FDA-1088. Where should I keep my medicine? Keep out of the reach of children. Store at room temperature between 15 and 30 degrees C (59 and 86 degrees F). Throw away any unused medicine after the expiration date. NOTE: This sheet is a summary. It may not cover all possible information. If you have questions about this medicine, talk to your doctor, pharmacist, or health care provider.  2020 Elsevier/Gold Standard (2014-03-06 13:15:49)

## 2020-01-07 ENCOUNTER — Other Ambulatory Visit: Payer: Medicare PPO

## 2020-01-07 DIAGNOSIS — Z113 Encounter for screening for infections with a predominantly sexual mode of transmission: Secondary | ICD-10-CM

## 2020-01-07 LAB — HIV ANTIBODY (ROUTINE TESTING W REFLEX): HIV 1&2 Ab, 4th Generation: NONREACTIVE

## 2020-01-07 LAB — HEPATITIS C ANTIBODY
Hepatitis C Ab: NONREACTIVE
SIGNAL TO CUT-OFF: 0.01 (ref <1.00)

## 2020-01-08 ENCOUNTER — Encounter: Payer: Self-pay | Admitting: Family Medicine

## 2020-01-08 LAB — C. TRACHOMATIS/N. GONORRHOEAE RNA
C. trachomatis RNA, TMA: NOT DETECTED
N. gonorrhoeae RNA, TMA: NOT DETECTED

## 2020-01-08 LAB — RPR (MONITOR) W/REFL

## 2020-01-12 LAB — TEST AUTHORIZATION

## 2020-01-12 LAB — PSA, TOTAL WITH REFLEX TO PSA, FREE: PSA, Total: 1 ng/mL (ref <=4.0)

## 2020-01-12 LAB — RPR: RPR Ser Ql: NONREACTIVE

## 2020-01-23 ENCOUNTER — Ambulatory Visit: Payer: Medicare PPO | Attending: Internal Medicine

## 2020-01-23 DIAGNOSIS — Z23 Encounter for immunization: Secondary | ICD-10-CM

## 2020-01-23 NOTE — Progress Notes (Signed)
   Covid-19 Vaccination Clinic  Name:  Bryce Cameron    MRN: [id number] DOB: 07-04-1956  01/23/2020  Bryce Cameron was observed post Covid-19 immunization for 15 minutes without incident. He was provided with Vaccine Information Sheet and instruction to access the V-Safe system.   Bryce Cameron was instructed to call 911 with any severe reactions post vaccine: Marland Kitchen Difficulty breathing  . Swelling of face and throat  . A fast heartbeat  . A bad rash all over body  . Dizziness and weakness   Immunizations Administered    Name Date Dose VIS Date Route   Pfizer COVID-19 Vaccine 01/23/2020  8:23 AM 0.3 mL 10/10/2019 Intramuscular   Manufacturer: [place]   Lot: [id number]   [place]: [id number]

## 2021-01-13 ENCOUNTER — Encounter: Payer: Self-pay | Admitting: Family Medicine

## 2021-01-13 ENCOUNTER — Other Ambulatory Visit: Payer: Self-pay

## 2021-01-13 ENCOUNTER — Ambulatory Visit: Payer: Medicare PPO | Admitting: Family Medicine

## 2021-01-13 VITALS — BP 128/78 | HR 91 | Temp 97.0°F | Wt 185.2 lb

## 2021-01-13 DIAGNOSIS — G629 Polyneuropathy, unspecified: Secondary | ICD-10-CM | POA: Insufficient documentation

## 2021-01-13 DIAGNOSIS — G6289 Other specified polyneuropathies: Secondary | ICD-10-CM

## 2021-01-13 DIAGNOSIS — E782 Mixed hyperlipidemia: Secondary | ICD-10-CM

## 2021-01-13 DIAGNOSIS — Z125 Encounter for screening for malignant neoplasm of prostate: Secondary | ICD-10-CM

## 2021-01-13 DIAGNOSIS — M1A9XX0 Chronic gout, unspecified, without tophus (tophi): Secondary | ICD-10-CM

## 2021-01-13 DIAGNOSIS — R3911 Hesitancy of micturition: Secondary | ICD-10-CM | POA: Diagnosis not present

## 2021-01-13 DIAGNOSIS — N401 Enlarged prostate with lower urinary tract symptoms: Secondary | ICD-10-CM

## 2021-01-13 LAB — LIPID PANEL
Cholesterol: 219 mg/dL — ABNORMAL HIGH (ref 0–200)
HDL: 32.4 mg/dL — ABNORMAL LOW (ref >39.00)
NonHDL: 186.28
Total CHOL/HDL Ratio: 7
Triglycerides: 203 mg/dL — ABNORMAL HIGH (ref 0.0–149.0)
VLDL: 40.6 mg/dL — ABNORMAL HIGH (ref 0.0–40.0)

## 2021-01-13 LAB — TSH: TSH: 1.46 u[IU]/mL (ref 0.35–4.50)

## 2021-01-13 LAB — COMPREHENSIVE METABOLIC PANEL
ALT: 45 U/L (ref 0–53)
AST: 33 U/L (ref 0–37)
Albumin: 4.3 g/dL (ref 3.5–5.2)
Alkaline Phosphatase: 86 U/L (ref 39–117)
BUN: 15 mg/dL (ref 6–23)
CO2: 28 mEq/L (ref 19–32)
Calcium: 10 mg/dL (ref 8.4–10.5)
Chloride: 102 mEq/L (ref 96–112)
Creatinine, Ser: 0.75 mg/dL (ref 0.40–1.50)
GFR: 93.77 mL/min (ref >60.00)
Glucose, Bld: 80 mg/dL (ref 70–99)
Potassium: 5 mEq/L (ref 3.5–5.1)
Sodium: 140 mEq/L (ref 135–145)
Total Bilirubin: 0.4 mg/dL (ref 0.2–1.2)
Total Protein: 7.5 g/dL (ref 6.0–8.3)

## 2021-01-13 LAB — HEMOGLOBIN A1C: Hgb A1c MFr Bld: 5.9 % (ref 4.6–6.5)

## 2021-01-13 LAB — URIC ACID: Uric Acid, Serum: 6.5 mg/dL (ref 4.0–7.8)

## 2021-01-13 LAB — LDL CHOLESTEROL, DIRECT: Direct LDL: 173 mg/dL

## 2021-01-13 LAB — PSA, MEDICARE: PSA: 1.01 ng/ml (ref 0.10–4.00)

## 2021-01-13 LAB — VITAMIN B12: Vitamin B-12: 769 pg/mL (ref 211–911)

## 2021-01-13 MED ORDER — TAMSULOSIN HCL 0.4 MG PO CAPS: 0.4000 mg | ORAL_CAPSULE | Freq: Every day | ORAL | 3 refills | Status: DC

## 2021-01-13 MED ORDER — SIMVASTATIN 10 MG PO TABS: 10.0000 mg | ORAL_TABLET | Freq: Every day | ORAL | 3 refills | Status: DC

## 2021-01-13 NOTE — Progress Notes (Signed)
Subjective:     Bryce Cameron is a 65 y.o. male presenting for Check Mole on Back and Numbness in feet     HPI   #Mole - 2 on the back - one fell off  - the other one has been stable in size - no personal or family hx of skin cancer  #Numbness in the feet - has been flying and driving a lot  - 3 years ago on a trip to [place] started having tailbone pain - when this starts hurting it gets worse - at that time he had gout and took steroids  - only getting the tailbone pain when sitting - tickling and numbness in both feet - feels cold to the touch - shinny skin - no swelling - no claudication symptoms - has noticed the skin changes over the last 2 years - in the evening when laying down will get shooting pain in the toes - sharp and stabbing pain    Review of Systems   Social History   Tobacco Use  Smoking Status Former Smoker  . Packs/day: 0.50  . Years: 25.00  . Pack years: 12.50  . Types: Cigarettes  . Quit date: 11/12/2011  . Years since quitting: 9.1  Smokeless Tobacco Never Used        Objective:    BP Readings from Last 3 Encounters:  01/13/21 128/78  01/06/20 114/72  04/30/15 107/77   Wt Readings from Last 3 Encounters:  01/13/21 185 lb 4 oz (84 kg)  01/06/20 185 lb 12 oz (84.3 kg)  04/30/15 196 lb (88.9 kg)    BP 128/78 (BP Location: Left Arm, Patient Position: Sitting, Cuff Size: Normal)   Pulse 91   Temp (!) 97 F (36.1 C) (Temporal)   Wt 185 lb 4 oz (84 kg)   SpO2 93%   BMI 27.76 kg/m    Physical Exam Constitutional:      Appearance: Normal appearance. He is not ill-appearing or diaphoretic.  HENT:     Right Ear: External ear normal.     Left Ear: External ear normal.     Nose: Nose normal.  Eyes:     General: No scleral icterus.    Extraocular Movements: Extraocular movements intact.     Conjunctiva/sclera: Conjunctivae normal.  Cardiovascular:     Rate and Rhythm: Normal rate.     Pulses:          Dorsalis pedis  pulses are 2+ on the right side and 2+ on the left side.       Posterior tibial pulses are 2+ on the right side and 2+ on the left side.  Pulmonary:     Effort: Pulmonary effort is normal.  Musculoskeletal:     Cervical back: Neck supple.     Right foot: Normal range of motion. No deformity.     Left foot: Normal range of motion. No deformity.  Feet:     Right foot:     Protective Sensation: 4 sites tested. 4 sites sensed.     Left foot:     Protective Sensation: 4 sites tested. 4 sites sensed.     Comments: Smooth, shinny, cold feet w/o hair growth Skin:    General: Skin is warm and dry.  Neurological:     Mental Status: He is alert. Mental status is at baseline.  Psychiatric:        Mood and Affect: Mood normal.      The 10-year ASCVD risk score Mikey Bussing  DC Jr., et al., 2013) is: 17.4%   Values used to calculate the score:     Age: 80 years     Sex: Male     Is Non-Hispanic African American: No     Diabetic: No     Tobacco smoker: No     Systolic Blood Pressure: 510 mmHg     Is BP treated: No     HDL Cholesterol: 33.8 mg/dL     Total Cholesterol: 210 mg/dL       Assessment & Plan:   Problem List Items Addressed This Visit      Nervous and Auditory   Peripheral neuropathy    Etiology unclear. He does have cold, hairless feet - discussed considering ABI studies if blood work is unrevealing. Or EMG if abi is negative.       Relevant Orders   TSH   Vitamin B12   Hemoglobin A1c     Genitourinary   Benign prostatic hyperplasia with urinary hesitancy    Pt reports hesitancy. Trial of tamsulosin.       Relevant Medications   tamsulosin (FLOMAX) 0.4 MG CAPS capsule     Other   Mixed hyperlipidemia    Side effects to atorvastatin. Now willing to try alternative. Will try simvastatin.       Relevant Medications   simvastatin (ZOCOR) 10 MG tablet   Other Relevant Orders   Comprehensive metabolic panel   Lipid panel   Gout - Primary    Notes foot pain is  similar to gout prodrome w/o the pain ever starting will check uric acid due to hx.       Relevant Orders   Uric acid    Other Visit Diagnoses    Prostate cancer screening       Relevant Orders   PSA, Medicare       Return for next month for annual.  Lesleigh Noe, MD  This visit occurred during the SARS-CoV-2 public health emergency.  Safety protocols were in place, including screening questions prior to the visit, additional usage of staff PPE, and extensive cleaning of exam room while observing appropriate contact time as indicated for disinfecting solutions.

## 2021-01-13 NOTE — Assessment & Plan Note (Signed)
Pt reports hesitancy. Trial of tamsulosin.

## 2021-01-13 NOTE — Assessment & Plan Note (Signed)
Side effects to atorvastatin. Now willing to try alternative. Will try simvastatin.

## 2021-01-13 NOTE — Patient Instructions (Signed)
Start cholesterol medication  Labs today

## 2021-01-13 NOTE — Assessment & Plan Note (Signed)
Notes foot pain is similar to gout prodrome w/o the pain ever starting will check uric acid due to hx.

## 2021-01-13 NOTE — Assessment & Plan Note (Signed)
Etiology unclear. He does have cold, hairless feet - discussed considering ABI studies if blood work is unrevealing. Or EMG if abi is negative.

## 2021-02-10 ENCOUNTER — Other Ambulatory Visit: Payer: Self-pay

## 2021-02-10 DIAGNOSIS — R3911 Hesitancy of micturition: Secondary | ICD-10-CM

## 2021-02-10 DIAGNOSIS — N401 Enlarged prostate with lower urinary tract symptoms: Secondary | ICD-10-CM

## 2021-02-10 DIAGNOSIS — E782 Mixed hyperlipidemia: Secondary | ICD-10-CM

## 2021-02-10 MED ORDER — TAMSULOSIN HCL 0.4 MG PO CAPS: 0.4000 mg | ORAL_CAPSULE | Freq: Every day | ORAL | 0 refills | Status: DC

## 2021-02-10 MED ORDER — SIMVASTATIN 10 MG PO TABS: 10.0000 mg | ORAL_TABLET | Freq: Every day | ORAL | 2 refills | Status: DC

## 2021-02-16 ENCOUNTER — Ambulatory Visit (INDEPENDENT_AMBULATORY_CARE_PROVIDER_SITE_OTHER): Payer: Medicare PPO

## 2021-02-16 ENCOUNTER — Other Ambulatory Visit: Payer: Self-pay

## 2021-02-16 DIAGNOSIS — Z0000 Encounter for general adult medical examination without abnormal findings: Secondary | ICD-10-CM | POA: Diagnosis not present

## 2021-02-16 NOTE — Progress Notes (Signed)
PCP notes:  Health Maintenance: Declined pneumococcal, flu, Tdap, Shingrix vaccinations   colonoscopy- due   Abnormal Screenings: none   Patient concerns: Numbness in feet onset 3 years ago    Nurse concerns: none   Next PCP appt.: 02/21/2021 @ 10 am

## 2021-02-16 NOTE — Patient Instructions (Signed)
[name] , Thank you for taking time to come for your Medicare Wellness Visit. I appreciate your ongoing commitment to your health goals. Please review the following plan we discussed and let me know if I can assist you in the future.   Screening recommendations/referrals: Colonoscopy: due, will discuss with provider Recommended yearly ophthalmology/optometry visit for glaucoma screening and checkup Recommended yearly dental visit for hygiene and checkup  Vaccinations: Influenza vaccine: declined Pneumococcal vaccine: declined Tdap vaccine: declined Shingles vaccine: declined   Covid-19: Completed series  Advanced directives: Advance directive discussed with you today. Even though you declined this today please call our office should you change your mind and we can give you the proper paperwork for you to fill out.   Conditions/risks identified: hyperlipidemia   Next appointment: Follow up in one year for your annual wellness visit.   Preventive Care 67 Years and Older, Male Preventive care refers to lifestyle choices and visits with your health care provider that can promote health and wellness. What does preventive care include?  A yearly physical exam. This is also called an annual well check.  Dental exams once or twice a year.  Routine eye exams. Ask your health care provider how often you should have your eyes checked.  Personal lifestyle choices, including:  Daily care of your teeth and gums.  Regular physical activity.  Eating a healthy diet.  Avoiding tobacco and drug use.  Limiting alcohol use.  Practicing safe sex.  Taking low doses of aspirin every day.  Taking vitamin and mineral supplements as recommended by your health care provider. What happens during an annual well check? The services and screenings done by your health care provider during your annual well check will depend on your age, overall health, lifestyle risk factors, and family history  of disease. Counseling  Your health care provider may ask you questions about your:  Alcohol use.  Tobacco use.  Drug use.  Emotional well-being.  Home and relationship well-being.  Sexual activity.  Eating habits.  History of falls.  Memory and ability to understand (cognition).  Work and work Statistician. Screening  You may have the following tests or measurements:  Height, weight, and BMI.  Blood pressure.  Lipid and cholesterol levels. These may be checked every 5 years, or more frequently if you are over 67 years old.  Skin check.  Lung cancer screening. You may have this screening every year starting at age 40 if you have a 30-pack-year history of smoking and currently smoke or have quit within the past 15 years.  Fecal occult blood test (FOBT) of the stool. You may have this test every year starting at age 67.  Flexible sigmoidoscopy or colonoscopy. You may have a sigmoidoscopy every 5 years or a colonoscopy every 10 years starting at age 67.  Prostate cancer screening. Recommendations will vary depending on your family history and other risks.  Hepatitis C blood test.  Hepatitis B blood test.  Sexually transmitted disease (STD) testing.  Diabetes screening. This is done by checking your blood sugar (glucose) after you have not eaten for a while (fasting). You may have this done every 1-3 years.  Abdominal aortic aneurysm (AAA) screening. You may need this if you are a current or former smoker.  Osteoporosis. You may be screened starting at age 11 if you are at high risk. Talk with your health care provider about your test results, treatment options, and if necessary, the need for more tests. Vaccines  Your health  care provider may recommend certain vaccines, such as:  Influenza vaccine. This is recommended every year.  Tetanus, diphtheria, and acellular pertussis (Tdap, Td) vaccine. You may need a Td booster every 10 years.  Zoster vaccine. You may  need this after age 8.  Pneumococcal 13-valent conjugate (PCV13) vaccine. One dose is recommended after age 67.  Pneumococcal polysaccharide (PPSV23) vaccine. One dose is recommended after age 40. Talk to your health care provider about which screenings and vaccines you need and how often you need them. This information is not intended to replace advice given to you by your health care provider. Make sure you discuss any questions you have with your health care provider. Document Released: 11/12/2015 Document Revised: 07/05/2016 Document Reviewed: 08/17/2015 Elsevier Interactive Patient Education  2017 [employer] Prevention in the Home Falls can cause injuries. They can happen to people of all ages. There are many things you can do to make your home safe and to help prevent falls. What can I do on the outside of my home?  Regularly fix the edges of walkways and driveways and fix any cracks.  Remove anything that might make you trip as you walk through a door, such as a raised step or threshold.  Trim any bushes or trees on the path to your home.  Use bright outdoor lighting.  Clear any walking paths of anything that might make someone trip, such as rocks or tools.  Regularly check to see if handrails are loose or broken. Make sure that both sides of any steps have handrails.  Any raised decks and porches should have guardrails on the edges.  Have any leaves, snow, or ice cleared regularly.  Use sand or salt on walking paths during winter.  Clean up any spills in your garage right away. This includes oil or grease spills. What can I do in the bathroom?  Use night lights.  Install grab bars by the toilet and in the tub and shower. Do not use towel bars as grab bars.  Use non-skid mats or decals in the tub or shower.  If you need to sit down in the shower, use a plastic, non-slip stool.  Keep the floor dry. Clean up any water that spills on the floor as soon as it  happens.  Remove soap buildup in the tub or shower regularly.  Attach bath mats securely with double-sided non-slip rug tape.  Do not have throw rugs and other things on the floor that can make you trip. What can I do in the bedroom?  Use night lights.  Make sure that you have a light by your bed that is easy to reach.  Do not use any sheets or blankets that are too big for your bed. They should not hang down onto the floor.  Have a firm chair that has side arms. You can use this for support while you get dressed.  Do not have throw rugs and other things on the floor that can make you trip. What can I do in the kitchen?  Clean up any spills right away.  Avoid walking on wet floors.  Keep items that you use a lot in easy-to-reach places.  If you need to reach something above you, use a strong step stool that has a grab bar.  Keep electrical cords out of the way.  Do not use floor polish or wax that makes floors slippery. If you must use wax, use non-skid floor wax.  Do not have  throw rugs and other things on the floor that can make you trip. What can I do with my stairs?  Do not leave any items on the stairs.  Make sure that there are handrails on both sides of the stairs and use them. Fix handrails that are broken or loose. Make sure that handrails are as long as the stairways.  Check any carpeting to make sure that it is firmly attached to the stairs. Fix any carpet that is loose or worn.  Avoid having throw rugs at the top or bottom of the stairs. If you do have throw rugs, attach them to the floor with carpet tape.  Make sure that you have a light switch at the top of the stairs and the bottom of the stairs. If you do not have them, ask someone to add them for you. What else can I do to help prevent falls?  Wear shoes that:  Do not have high heels.  Have rubber bottoms.  Are comfortable and fit you well.  Are closed at the toe. Do not wear sandals.  If you  use a stepladder:  Make sure that it is fully opened. Do not climb a closed stepladder.  Make sure that both sides of the stepladder are locked into place.  Ask someone to hold it for you, if possible.  Clearly mark and make sure that you can see:  Any grab bars or handrails.  First and last steps.  Where the edge of each step is.  Use tools that help you move around (mobility aids) if they are needed. These include:  Canes.  Walkers.  Scooters.  Crutches.  Turn on the lights when you go into a dark area. Replace any light bulbs as soon as they burn out.  Set up your furniture so you have a clear path. Avoid moving your furniture around.  If any of your floors are uneven, fix them.  If there are any pets around you, be aware of where they are.  Review your medicines with your doctor. Some medicines can make you feel dizzy. This can increase your chance of falling. Ask your doctor what other things that you can do to help prevent falls. This information is not intended to replace advice given to you by your health care provider. Make sure you discuss any questions you have with your health care provider. Document Released: 08/12/2009 Document Revised: 03/23/2016 Document Reviewed: 11/20/2014 Elsevier Interactive Patient Education  2017 [employer].

## 2021-02-16 NOTE — Progress Notes (Signed)
Subjective:   Bryce Cameron is a 65 y.o. male who presents for Medicare Annual/Subsequent preventive examination.  Review of Systems: N/A      I connected with the patient today by telephone and verified that I am speaking with the correct person using two identifiers. Location patient: home Location nurse: work Persons participating in the telephone visit: patient, nurse.   I discussed the limitations, risks, security and privacy concerns of performing an evaluation and management service by telephone and the availability of in person appointments. I also discussed with the patient that there may be a patient responsible charge related to this service. The patient expressed understanding and verbally consented to this telephonic visit.        Cardiac Risk Factors include: advanced age (>93men, >65 women);male gender;Other (see comment), Risk factor comments: hyperlipidemia     Objective:    Today's Vitals   There is no height or weight on file to calculate BMI.  Advanced Directives 02/16/2021 04/30/2015 04/15/2015 11/11/2013  Does Patient Have a Medical Advance Directive? No No No Patient has advance directive, copy not in chart  Would patient like information on creating a medical advance directive? No - Patient declined No - patient declined information - -    Current Medications (verified) Outpatient Encounter Medications as of 02/16/2021  Medication Sig  . Ascorbic Acid (VITAMIN C) 1000 MG tablet Take 1,000 mg by mouth daily.  . Calcium Carbonate-Vit D-Min (CALCIUM 1200 PO) Take by mouth.  . cyanocobalamin 100 MCG tablet Take 100 mcg by mouth daily.  Marland Kitchen esomeprazole (NEXIUM) 20 MG capsule Take 20 mg by mouth. Every 3 days  . magnesium 30 MG tablet Take 30 mg by mouth 2 (two) times daily.  . Niacin (VITAMIN B-3 PO) Take by mouth.  . Red Yeast Rice 600 MG CAPS Take by mouth.  . simvastatin (ZOCOR) 10 MG tablet Take 1 tablet (10 mg total) by mouth at bedtime.  . tamsulosin  (FLOMAX) 0.4 MG CAPS capsule Take 1 capsule (0.4 mg total) by mouth daily.  . Zinc Sulfate (ZINC 15 PO) Take by mouth.   No facility-administered encounter medications on file as of 02/16/2021.    Allergies (verified) Patient has no known allergies.   History: Past Medical History:  Diagnosis Date  . Barrett's esophagus 03/13/2008   short  . DJD (degenerative joint disease)   . Gout 2016   diagnosed 2016  . Hx of adenomatous polyp of colon 03/13/2008  . Wears glasses    Past Surgical History:  Procedure Laterality Date  . COLONOSCOPY    . ELBOW ARTHROSCOPY Left 11/14/2013   Procedure: ARTHROSCOPY LEFT ELBOW, SYNOVECTOMY, DEBRIDEMENT, REMOVAL OF LOOSE BODIES;  Surgeon: Ninetta Lights, MD;  Location: [facility];  Service: Orthopedics;  Laterality: Left;  . KNEE ARTHROPLASTY  2008   right  . KNEE ARTHROSCOPY  2012   left  . NASAL POLYP EXCISION    . SHOULDER ARTHROSCOPY  2011   left  . SHOULDER ARTHROSCOPY W/ ROTATOR CUFF REPAIR  2008   right  . SHOULDER INJECTION Left 11/14/2013   Procedure: SHOULDER INJECTION;  Surgeon: Ninetta Lights, MD;  Location: [facility];  Service: Orthopedics;  Laterality: Left;  . TONSILLECTOMY    . UPPER GASTROINTESTINAL ENDOSCOPY     Family History  Problem Relation Age of Onset  . Colon cancer Mother 32  . Lung cancer Father   . Heart disease Brother 29  had bypass surgery   Social History   Socioeconomic History  . Marital status: Divorced    Spouse name: Not on file  . Number of children: 3  . Years of education: college  . Highest education level: Not on file  Occupational History  . Not on file  Tobacco Use  . Smoking status: Current Some Day Smoker    Years: 25.00    Types: Cigars    Last attempt to quit: 11/12/2011    Years since quitting: 9.2  . Smokeless tobacco: Never Used  . Tobacco comment: maybe a cigar when golfing  Vaping Use  . Vaping Use: Never used  Substance and Sexual  Activity  . Alcohol use: Yes    Alcohol/week: 0.0 standard drinks    Comment: rarely, beer once a month  . Drug use: No  . Sexual activity: Yes    Birth control/protection: None  Other Topics Concern  . Not on file  Social History Narrative   01/06/20   From: born in [place], but in [place] since 1989   Living: with son [name]   Work: was working with [employer] - has been unemployed since [employer]      Family: has 3 children (son [name], Radiation protection practitioner; daughter - died, one adopted child      Enjoys: play golf and soccer (but has not been playing since Covid)      Exercise: golf, would like to go back to soccer, exercising occasionally   Diet: eats responsibly, cooks at home      Safety   Seat belts: Yes    Guns: Yes  and secure   Safe in relationships: Yes    Social Determinants of Health   Financial Resource Strain: Low Risk   . Difficulty of Paying Living Expenses: Not hard at all  Food Insecurity: No Food Insecurity  . Worried About Charity fundraiser in the Last Year: Never true  . Ran Out of Food in the Last Year: Never true  Transportation Needs: No Transportation Needs  . Lack of Transportation (Medical): No  . Lack of Transportation (Non-Medical): No  Physical Activity: Sufficiently Active  . Days of Exercise per Week: 4 days  . Minutes of Exercise per Session: 60 min  Stress: No Stress Concern Present  . Feeling of Stress : Not at all  Social Connections: Not on file    Tobacco Counseling Ready to quit: Not Answered Counseling given: Not Answered Comment: maybe a cigar when golfing   Clinical Intake:  Pre-visit preparation completed: Yes  Pain : No/denies pain     Nutritional Risks: None Diabetes: No  How often do you need to have someone help you when you read instructions, pamphlets, or other written materials from your doctor or pharmacy?: 1 - Never  Diabetic: No Nutrition Risk Assessment:  Has the patient had any N/V/D within the last 2 months?  No  Does  the patient have any non-healing wounds?  No  Has the patient had any unintentional weight loss or weight gain?  No   Diabetes:  Is the patient diabetic?  No  If diabetic, was a CBG obtained today?  N/A Did the patient bring in their glucometer from home?  N/A How often do you monitor your CBG's? N/A.   Financial Strains and Diabetes Management:  Are you having any financial strains with the device, your supplies or your medication? N/A.  Does the patient want to be seen by Chronic Care Management for management  of their diabetes?  N/A Would the patient like to be referred to a Nutritionist or for Diabetic Management?  N/A   Interpreter Needed?: No  Information entered by :: CJohnson, LPN   Activities of Daily Living In your present state of health, do you have any difficulty performing the following activities: 02/16/2021  Hearing? N  Vision? N  Difficulty concentrating or making decisions? N  Walking or climbing stairs? N  Dressing or bathing? N  Doing errands, shopping? N  Preparing Food and eating ? N  Using the Toilet? N  In the past six months, have you accidently leaked urine? N  Do you have problems with loss of bowel control? N  Managing your Medications? N  Managing your Finances? N  Housekeeping or managing your Housekeeping? N  Some recent data might be hidden    Patient Care Team: Lesleigh Noe, MD as PCP - General (Family Medicine)  Indicate any recent Medical Services you may have received from other than [facility] providers in the past year (date may be approximate).     Assessment:   This is a routine wellness examination for [place].  Hearing/Vision screen  Hearing Screening   125Hz  250Hz  500Hz  1000Hz  2000Hz  3000Hz  4000Hz  6000Hz  8000Hz   Right ear:           Left ear:           Vision Screening Comments: Patient gets annual eye exams   Dietary issues and exercise activities discussed: Current Exercise Habits: Home exercise routine, Type of exercise:  Other - see comments (golf), Time (Minutes): > 60, Frequency (Times/Week): 4, Weekly Exercise (Minutes/Week): 0, Intensity: Moderate, Exercise limited by: None identified  Goals    . Patient Stated     02/16/2021, I will continue to play golf 4 days a week for 4 hours.       Depression Screen PHQ 2/9 Scores 02/16/2021 01/06/2020  PHQ - 2 Score 0 0  PHQ- 9 Score 0 -    Fall Risk Fall Risk  02/16/2021  Falls in the past year? 0  Number falls in past yr: 0  Injury with Fall? 0  Risk for fall due to : No Fall Risks  Follow up Falls evaluation completed;Falls prevention discussed    FALL RISK PREVENTION PERTAINING TO THE HOME:  Any stairs in or around the home? Yes  If so, are there any without handrails? No  Home free of loose throw rugs in walkways, pet beds, electrical cords, etc? Yes  Adequate lighting in your home to reduce risk of falls? Yes   ASSISTIVE DEVICES UTILIZED TO PREVENT FALLS:  Life alert? No  Use of a cane, walker or w/c? No  Grab bars in the bathroom? No  Shower chair or bench in shower? No  Elevated toilet seat or a handicapped toilet? No   TIMED UP AND GO:  Was the test performed? N/A telephone visit .    Cognitive Function: MMSE - Mini Mental State Exam 02/16/2021  Not completed: Refused       Mini Cog  Mini-Cog screen was completed. Maximum score is 22. A value of 0 denotes this part of the MMSE was not completed or the patient failed this part of the Mini-Cog screening.  Immunizations Immunization History  Administered Date(s) Administered  . PFIZER(Purple Top)SARS-COV-2 Vaccination 01/02/2020, 01/23/2020, 08/09/2020  . Td 03/14/2010    TDAP status: declined  Flu Vaccine status: declined  Pneumococcal vaccine status: declined  Covid-19 vaccine status: Completed vaccines  Qualifies for Shingles Vaccine? Yes   Zostavax completed No   Shingrix Completed: declined  Screening Tests Health Maintenance  Topic Date Due  . COLONOSCOPY (Pts  45-82yrs Insurance coverage will need to be confirmed)  04/29/2018  . PNA vac Low Risk Adult (1 of 2 - PCV13) 02/17/2023 (Originally 02/17/2019)  . TETANUS/TDAP  02/17/2024 (Originally 03/14/2020)  . INFLUENZA VACCINE  05/30/2021  . COVID-19 Vaccine  Completed  . Hepatitis C Screening  Completed  . HPV VACCINES  Aged Out    Health Maintenance  Health Maintenance Due  Topic Date Due  . COLONOSCOPY (Pts 45-25yrs Insurance coverage will need to be confirmed)  04/29/2018    Colorectal cancer screening: due, will discuss with provider  Lung Cancer Screening: (Low Dose CT Chest recommended if Age 31-80 years, 30 pack-year currently smoking OR have quit w/in 15years.) does not qualify.   Additional Screening:  Hepatitis C Screening: does qualify; Completed 01/06/2020  Vision Screening: Recommended annual ophthalmology exams for early detection of glaucoma and other disorders of the eye. Is the patient up to date with their annual eye exam?  Yes  Who is the provider or what is the name of the office in which the patient attends annual eye exams? [facility]  If pt is not established with a provider, would they like to be referred to a provider to establish care? No .   Dental Screening: Recommended annual dental exams for proper oral hygiene  Community Resource Referral / Chronic Care Management: CRR required this visit?  No   CCM required this visit?  No      Plan:     I have personally reviewed and noted the following in the patient's chart:   . Medical and social history . Use of alcohol, tobacco or illicit drugs  . Current medications and supplements . Functional ability and status . Nutritional status . Physical activity . Advanced directives . List of other physicians . Hospitalizations, surgeries, and ER visits in previous 12 months . Vitals . Screenings to include cognitive, depression, and falls . Referrals and appointments  In addition, I have reviewed and  discussed with patient certain preventive protocols, quality metrics, and best practice recommendations. A written personalized care plan for preventive services as well as general preventive health recommendations were provided to patient.   Due to this being a telephonic visit, the after visit summary with patients personalized plan was offered to patient via office or my-chart. Patient preferred to pick up at office at next visit or via mychart.   Andrez Grime, LPN   [id number]

## 2021-02-21 ENCOUNTER — Encounter: Payer: Self-pay | Admitting: Family Medicine

## 2021-02-21 ENCOUNTER — Ambulatory Visit (INDEPENDENT_AMBULATORY_CARE_PROVIDER_SITE_OTHER): Payer: Medicare PPO | Admitting: Family Medicine

## 2021-02-21 ENCOUNTER — Other Ambulatory Visit: Payer: Self-pay

## 2021-02-21 VITALS — BP 118/62 | HR 82 | Temp 98.2°F | Ht 68.2 in | Wt 182.2 lb

## 2021-02-21 DIAGNOSIS — N401 Enlarged prostate with lower urinary tract symptoms: Secondary | ICD-10-CM

## 2021-02-21 DIAGNOSIS — Z1211 Encounter for screening for malignant neoplasm of colon: Secondary | ICD-10-CM

## 2021-02-21 DIAGNOSIS — R7303 Prediabetes: Secondary | ICD-10-CM | POA: Insufficient documentation

## 2021-02-21 DIAGNOSIS — Z720 Tobacco use: Secondary | ICD-10-CM | POA: Diagnosis not present

## 2021-02-21 DIAGNOSIS — G6289 Other specified polyneuropathies: Secondary | ICD-10-CM

## 2021-02-21 DIAGNOSIS — R3911 Hesitancy of micturition: Secondary | ICD-10-CM

## 2021-02-21 DIAGNOSIS — E782 Mixed hyperlipidemia: Secondary | ICD-10-CM

## 2021-02-21 DIAGNOSIS — K22719 Barrett's esophagus with dysplasia, unspecified: Secondary | ICD-10-CM | POA: Diagnosis not present

## 2021-02-21 MED ORDER — GABAPENTIN 300 MG PO CAPS
300.0000 mg | ORAL_CAPSULE | Freq: Every day | ORAL | 0 refills | Status: DC
Start: 2021-02-21 — End: 2021-07-14

## 2021-02-21 NOTE — Progress Notes (Signed)
Annual Exam   Chief Complaint:  Chief Complaint  Patient presents with  . Medicare Wellness    History of Present Illness:  Bryce Cameron is a 65 y.o. presents today for annual examination.    #BPH - now taking tamsulosin every 3 days - saw palmetto daily which has helped ejaculation issue  #burning in the feet - long standing - poor sensation - worse at night - has never tried medication - worse if not active  Nutrition/Lifestyle Diet: high protein diet, protein shake x 2 and one healthy meal Exercise: walking when golf - 3 times a week He is single partner, contraception - post menopausal status.  Any issues with getting or keeping erection? Yes  Social History   Tobacco Use  Smoking Status Current Some Day Smoker  . Years: 25.00  . Types: Cigars  . Last attempt to quit: 11/12/2011  . Years since quitting: 9.2  Smokeless Tobacco Never Used  Tobacco Comment   maybe a cigar when golfing   Social History   Substance and Sexual Activity  Alcohol Use Yes  . Alcohol/week: 0.0 standard drinks   Comment: rarely, beer once a month   Social History   Substance and Sexual Activity  Drug Use No     Safety The patient wears seatbelts: yes.     The patient feels safe at home and in their relationships: yes.  General Health Dentist in the last year: Yes Eye doctor: yes  Weight Wt Readings from Last 3 Encounters:  02/21/21 182 lb 4 oz (82.7 kg)  01/13/21 185 lb 4 oz (84 kg)  01/06/20 185 lb 12 oz (84.3 kg)   Patient has high BMI  BMI Readings from Last 1 Encounters:  02/21/21 27.55 kg/m     Chronic disease screening Blood pressure monitoring:  BP Readings from Last 3 Encounters:  02/21/21 118/62  01/13/21 128/78  01/06/20 114/72    Lipid Monitoring: Indication for screening: age >35, obesity, diabetes, family hx, CV risk factors.  Lipid screening: Not Indicated  Lab Results  Component Value Date   CHOL 219 (H) 01/13/2021   HDL 32.40 (L)  01/13/2021   LDLCALC 138 (H) 01/06/2020   LDLDIRECT 173.0 01/13/2021   TRIG 203.0 (H) 01/13/2021   CHOLHDL 7 01/13/2021     Diabetes Screening: age >23, overweight, family hx, PCOS, hx of gestational diabetes, at risk ethnicity, elevated blood pressure >135/80.  Diabetes Screening screening: Not Indicated  Lab Results  Component Value Date   HGBA1C 5.9 01/13/2021     Prostate Cancer Screening: Yes Age 75-69 yo Shared Decision Making Higher Risk: Older age, African American, Family Hx of Prostate Cancer - No Benefits: screening may prevent 1.3 deaths from prostate cancer over 13 years per 1000 men screened and prevent 3 metastatic cases per 1000 men screened. Not enough evidence to support more benefit for AA or [place] Harms: False Positive and psychological harms. 15% of me with false positive over a 2 to 4 year period > resulting in biopsy and complications such as pain, hematospermia, infections. Overdiagnosis - increases with age - found that 20-50% of prostate cancer through screening may have never caused any issues. Harms of treatment include - erectile dysfunction, urinary incontinence, and bothersome bowel symptoms.   After discussion he does want to get a PSA checked today.   Inadequate evidence for screening <55 No mortality benefit for screening >70   Lab Results  Component Value Date   PSA 1.01 01/13/2021   PSA 0.88  06/10/2010   PSA 0.71 02/12/2008       Colon Cancer Screening:  Age 59-75 yo - benefits outweigh the risk. Adults 14-85 yo who have never been screened benefit.  Benefits: 134000 people in 2016 will be diagnosed and 49,000 will die - early detection helps Harms: Complications 2/2 to colonoscopy High Risk (Colonoscopy): genetic disorder (Lynch syndrome or familial adenomatous polyposis), personal hx of IBD, previous adenomatous polyp, or previous colorectal cancer, FamHx start 10 years before the age at diagnosis, increased in males and black  race  Options:  FIT - looks for hemoglobin (blood in the stool) - specific and fairly sensitive - must be done annually Cologuard - looks for DNA and blood - more sensitive - therefore can have more false positives, every 3 years Colonoscopy - every 10 years if normal - sedation, bowl prep, must have someone drive you  Shared decision making and the patient had decided to do colonscopy - referral placed.   Social History   Tobacco Use  Smoking Status Current Some Day Smoker  . Years: 25.00  . Types: Cigars  . Last attempt to quit: 11/12/2011  . Years since quitting: 9.2  Smokeless Tobacco Never Used  Tobacco Comment   maybe a cigar when golfing    Lung Cancer Screening (Ages 93-23): not applicable 20 year pack history? No Current Tobacco user? Yes Quit less than 15 years ago? No Interested in low dose CT for lung cancer screening? not applicable  Abdominal Aortic Aneurysm:  Age 63-75, 1 time screening, men who have ever smoked discussed    Past Medical History:  Diagnosis Date  . Barrett's esophagus 03/13/2008   short  . DJD (degenerative joint disease)   . Gout 2016   diagnosed 2016  . Hx of adenomatous polyp of colon 03/13/2008  . Wears glasses     Past Surgical History:  Procedure Laterality Date  . COLONOSCOPY    . ELBOW ARTHROSCOPY Left 11/14/2013   Procedure: ARTHROSCOPY LEFT ELBOW, SYNOVECTOMY, DEBRIDEMENT, REMOVAL OF LOOSE BODIES;  Surgeon: Ninetta Lights, MD;  Location: [facility];  Service: Orthopedics;  Laterality: Left;  . KNEE ARTHROPLASTY  2008   right  . KNEE ARTHROSCOPY  2012   left  . NASAL POLYP EXCISION    . SHOULDER ARTHROSCOPY  2011   left  . SHOULDER ARTHROSCOPY W/ ROTATOR CUFF REPAIR  2008   right  . SHOULDER INJECTION Left 11/14/2013   Procedure: SHOULDER INJECTION;  Surgeon: Ninetta Lights, MD;  Location: [facility];  Service: Orthopedics;  Laterality: Left;  . TONSILLECTOMY    . UPPER GASTROINTESTINAL  ENDOSCOPY    . VASECTOMY      Prior to Admission medications   Medication Sig Start Date End Date Taking? Authorizing Provider  Ascorbic Acid (VITAMIN C) 1000 MG tablet Take 1,000 mg by mouth daily.   Yes [provider]  Calcium Carbonate-Vit D-Min (CALCIUM 1200 PO) Take by mouth.   Yes [provider]  cyanocobalamin 100 MCG tablet Take 100 mcg by mouth daily.   Yes [provider]  esomeprazole (NEXIUM) 20 MG capsule Take 20 mg by mouth. Every 3 days   Yes [provider]  magnesium 30 MG tablet Take 30 mg by mouth 2 (two) times daily.   Yes [provider]  Niacin (VITAMIN B-3 PO) Take by mouth.   Yes [provider]  Red Yeast Rice 600 MG CAPS Take by mouth.   Yes [provider]  saw palmetto 80 MG capsule Take 300 mg by mouth 2 (two) times daily.   Yes [provider]  simvastatin (ZOCOR) 10 MG tablet Take 1 tablet (10 mg total) by mouth at bedtime. 02/10/21  Yes Lesleigh Noe, MD  tamsulosin (FLOMAX) 0.4 MG CAPS capsule Take 1 capsule (0.4 mg total) by mouth daily. 02/10/21  Yes Lesleigh Noe, MD  Zinc Sulfate (ZINC 15 PO) Take by mouth.   Yes [provider]    No Known Allergies   Social History   Socioeconomic History  . Marital status: Divorced    Spouse name: Not on file  . Number of children: 3  . Years of education: college  . Highest education level: Not on file  Occupational History  . Not on file  Tobacco Use  . Smoking status: Current Some Day Smoker    Years: 25.00    Types: Cigars    Last attempt to quit: 11/12/2011    Years since quitting: 9.2  . Smokeless tobacco: Never Used  . Tobacco comment: maybe a cigar when golfing  Vaping Use  . Vaping Use: Never used  Substance and Sexual Activity  . Alcohol use: Yes    Alcohol/week: 0.0 standard drinks    Comment: rarely, beer once a month  . Drug use: No  . Sexual activity: Yes    Birth control/protection: None  Other  Topics Concern  . Not on file  Social History Narrative   01/06/20   From: born in [place], but in [place] since 1989   Living: with son [name]   Work: was working with [employer] - has been unemployed since [employer]      Family: has 3 children (son [name], Radiation protection practitioner; daughter - died, one adopted child      Enjoys: play golf and soccer (but has not been playing since Covid)      Exercise: golf, would like to go back to soccer, exercising occasionally   Diet: eats responsibly, cooks at home      Safety   Seat belts: Yes    Guns: Yes  and secure   Safe in relationships: Yes    Social Determinants of Health   Financial Resource Strain: Low Risk   . Difficulty of Paying Living Expenses: Not hard at all  Food Insecurity: No Food Insecurity  . Worried About Charity fundraiser in the Last Year: Never true  . Ran Out of Food in the Last Year: Never true  Transportation Needs: No Transportation Needs  . Lack of Transportation (Medical): No  . Lack of Transportation (Non-Medical): No  Physical Activity: Sufficiently Active  . Days of Exercise per Week: 4 days  . Minutes of Exercise per Session: 60 min  Stress: No Stress Concern Present  . Feeling of Stress : Not at all  Social Connections: Not on file  Intimate Partner Violence: Not At Risk  . Fear of Current or Ex-Partner: No  . Emotionally Abused: No  . Physically Abused: No  . Sexually Abused: No    Family History  Problem Relation Age of Onset  . Colon cancer Mother 79  . Lung cancer Father   . Heart disease Brother 74       had bypass surgery    Review of Systems  Constitutional: Negative for chills and fever.  HENT: Negative for congestion and sore throat.   Eyes: Negative for blurred vision and double vision.  Respiratory: Negative for shortness of  breath.   Cardiovascular: Negative for chest pain.  Gastrointestinal: Negative for heartburn, nausea and vomiting.  Genitourinary: Negative.   Musculoskeletal: Negative.  Negative  for myalgias.  Skin: Negative for rash.  Neurological: Negative for dizziness and headaches.  Endo/Heme/Allergies: Does not bruise/bleed easily.  Psychiatric/Behavioral: Negative for depression. The patient is not nervous/anxious.      Physical Exam BP 118/62   Pulse 82   Temp 98.2 F (36.8 C) (Temporal)   Ht 5' 8.2" (1.732 m)   Wt 182 lb 4 oz (82.7 kg)   SpO2 96%   BMI 27.55 kg/m    BP Readings from Last 3 Encounters:  02/21/21 118/62  01/13/21 128/78  01/06/20 114/72      Physical Exam Constitutional:      General: He is not in acute distress.    Appearance: He is well-developed. He is not diaphoretic.  HENT:     Head: Normocephalic and atraumatic.     Right Ear: Tympanic membrane and ear canal normal.     Left Ear: Tympanic membrane and ear canal normal.     Nose: Nose normal.     Mouth/Throat:     Pharynx: Uvula midline.  Eyes:     General: No scleral icterus.    Conjunctiva/sclera: Conjunctivae normal.     Pupils: Pupils are equal, round, and reactive to light.  Cardiovascular:     Rate and Rhythm: Normal rate and regular rhythm.     Heart sounds: Normal heart sounds. No murmur heard.   Pulmonary:     Effort: Pulmonary effort is normal. No respiratory distress.     Breath sounds: Normal breath sounds. No wheezing.  Abdominal:     General: Bowel sounds are normal. There is no distension.     Palpations: Abdomen is soft. There is no mass.     Tenderness: There is no abdominal tenderness. There is no guarding.  Musculoskeletal:        General: Normal range of motion.     Cervical back: Normal range of motion and neck supple.     Comments: Foot No lesions No ttp  Lymphadenopathy:     Cervical: No cervical adenopathy.  Skin:    General: Skin is warm and dry.     Capillary Refill: Capillary refill takes less than 2 seconds.  Neurological:     Mental Status: He is alert and oriented to person, place, and time.        Results:  PHQ-9:  Flowsheet  Row Clinical Support from 02/16/2021 in [employer] at [facility]  PHQ-9 Total Score 0        Assessment: 65 y.o. here for routine annual physical examination.  Plan: Problem List Items Addressed This Visit      Digestive   Barrett's esophagus    Overdue for f/u. Referral back to Dr. Carlean Purl.       Relevant Orders   Ambulatory referral to Gastroenterology     Nervous and Auditory   Peripheral neuropathy    Trial of gabapentin for pain relief at night. Consider ABI vs neurology if no improvement      Relevant Medications   gabapentin (NEURONTIN) 300 MG capsule     Genitourinary   Benign prostatic hyperplasia with urinary hesitancy     Other   Mixed hyperlipidemia - Primary   Relevant Orders   Lipid panel   Prediabetes    Other Visit Diagnoses    Tobacco use       Relevant Orders  VAS [place] AAA DUPLEX   Colon cancer screening       Relevant Orders   Ambulatory referral to Gastroenterology      Screening: -- Blood pressure screen normal -- cholesterol screening: not due for screening -- Weight screening: overweight: continue to monitor -- Diabetes Screening: not due for screening -- Nutrition: normal - Encouraged healthy diet  The 10-year ASCVD risk score Mikey Bussing DC Jr., et al., 2013) is: 22%   Values used to calculate the score:     Age: 68 years     Sex: Male     Is Non-Hispanic African American: No     Diabetic: No     Tobacco smoker: Yes     Systolic Blood Pressure: 123456 mmHg     Is BP treated: No     HDL Cholesterol: 32.4 mg/dL     Total Cholesterol: 219 mg/dL  -- ASA 81 mg discussed if CVD risk >10% age 14-59 and willing to take for 10 years -- Statin therapy for Age 21-75 with CVD risk >7.5%  Psych -- Depression screening (PHQ-9): negative  Safety -- tobacco screening: not interested in quitting -- alcohol screening:  low-risk usage. -- no evidence of domestic violence or intimate partner violence.  Cancer Screening -- Prostate (age  3-69) up to date -- Colon (age 19-75) overdue, stressed importance of GI f/u and referral palced -- Lung not indicated   Immunizations Immunization History  Administered Date(s) Administered  . PFIZER(Purple Top)SARS-COV-2 Vaccination 01/02/2020, 01/23/2020, 08/09/2020  . Td 03/14/2010    -- flu vaccine not in season -- TDAP q10 years up to date -- Shingles (age >60) declined -- PPSV-23 (19-64 with chronic disease or smoking) declined -- PCV-13 (age >28) - one dose followed by PPSV-23 1 year later declined -- Covid-19 Vaccine up to date  Encouraged regular vision and dental screening. Encouraged healthy exercise and diet.   Lesleigh Noe

## 2021-02-21 NOTE — Assessment & Plan Note (Signed)
Overdue for f/u. Referral back to Dr. Carlean Purl.

## 2021-02-21 NOTE — Assessment & Plan Note (Signed)
Trial of gabapentin for pain relief at night. Consider ABI vs neurology if no improvement

## 2021-02-21 NOTE — Patient Instructions (Addendum)
#  Referral I have placed a referral to a specialist for you. You should receive a phone call from the specialty office. Make sure your voicemail is not full and that if you are able to answer your phone to unknown or new numbers.   It may take up to 2 weeks to hear about the referral. If you do not hear anything in 2 weeks, please call our office and ask to speak with the referral coordinator.   - GI specialist - imaging for the aorta -- ultrasound  - 2 months for lab appoointment for cholesterol  Mychart message to update about the gabapentin for the nerve pain

## 2021-02-28 ENCOUNTER — Encounter: Payer: Self-pay | Admitting: Internal Medicine

## 2021-04-05 ENCOUNTER — Ambulatory Visit (INDEPENDENT_AMBULATORY_CARE_PROVIDER_SITE_OTHER): Payer: Medicare PPO

## 2021-04-05 ENCOUNTER — Other Ambulatory Visit: Payer: Self-pay

## 2021-04-05 DIAGNOSIS — Z87891 Personal history of nicotine dependence: Secondary | ICD-10-CM

## 2021-04-05 DIAGNOSIS — Z720 Tobacco use: Secondary | ICD-10-CM

## 2021-05-09 ENCOUNTER — Ambulatory Visit (AMBULATORY_SURGERY_CENTER): Payer: Medicare PPO

## 2021-05-09 VITALS — Ht 68.0 in | Wt 178.0 lb

## 2021-05-09 DIAGNOSIS — Z86010 Personal history of colonic polyps: Secondary | ICD-10-CM

## 2021-05-09 DIAGNOSIS — K2270 Barrett's esophagus without dysplasia: Secondary | ICD-10-CM

## 2021-05-09 MED ORDER — NA SULFATE-K SULFATE-MG SULF 17.5-3.13-1.6 GM/177ML PO SOLN: 1.0000 | Freq: Once | ORAL | 0 refills | Status: AC

## 2021-05-09 NOTE — Progress Notes (Signed)
No egg or soy allergy known to patient  No issues with past sedation with any surgeries or procedures Patient denies ever being told they had issues or difficulty with intubation  No FH of Malignant Hyperthermia No diet pills per patient No home 02 use per patient  No blood thinners per patient  Pt denies issues with constipation  No A fib or A flutter  EMMI video to pt or via [place] 19 guidelines implemented in PV today with Pt and RN  Pt is fully vaccinated  for Covid   Virtual previsit   NO PA's for preps discussed with pt In PV today  Discussed with pt there will be an out-of-pocket cost for prep and that varies from $0 to 70 dollars   Due to the COVID-19 pandemic we are asking patients to follow certain guidelines.  Pt aware of COVID protocols and [facility] guidelines

## 2021-05-19 ENCOUNTER — Encounter: Payer: Self-pay | Admitting: Certified Registered Nurse Anesthetist

## 2021-05-20 ENCOUNTER — Other Ambulatory Visit: Payer: Self-pay

## 2021-05-20 ENCOUNTER — Ambulatory Visit (AMBULATORY_SURGERY_CENTER): Payer: Medicare PPO | Admitting: Internal Medicine

## 2021-05-20 ENCOUNTER — Encounter: Payer: Self-pay | Admitting: Internal Medicine

## 2021-05-20 VITALS — BP 117/76 | HR 83 | Temp 97.8°F | Resp 19 | Ht 68.0 in | Wt 178.0 lb

## 2021-05-20 DIAGNOSIS — K2270 Barrett's esophagus without dysplasia: Secondary | ICD-10-CM | POA: Diagnosis not present

## 2021-05-20 DIAGNOSIS — K219 Gastro-esophageal reflux disease without esophagitis: Secondary | ICD-10-CM | POA: Diagnosis not present

## 2021-05-20 DIAGNOSIS — K635 Polyp of colon: Secondary | ICD-10-CM | POA: Diagnosis not present

## 2021-05-20 DIAGNOSIS — K208 Other esophagitis without bleeding: Secondary | ICD-10-CM | POA: Diagnosis not present

## 2021-05-20 DIAGNOSIS — K2970 Gastritis, unspecified, without bleeding: Secondary | ICD-10-CM | POA: Diagnosis not present

## 2021-05-20 DIAGNOSIS — Z86010 Personal history of colonic polyps: Secondary | ICD-10-CM

## 2021-05-20 DIAGNOSIS — D125 Benign neoplasm of sigmoid colon: Secondary | ICD-10-CM

## 2021-05-20 MED ORDER — SODIUM CHLORIDE 0.9 % IV SOLN: 500.0000 mL | Freq: Once | INTRAVENOUS | Status: DC

## 2021-05-20 NOTE — Progress Notes (Signed)
Report given to PACU, vss 

## 2021-05-20 NOTE — Op Note (Signed)
[facility] Patient Name: Bryce Cameron Procedure Date: 05/20/2021 2:30 PM MRN: [id number] Endoscopist: Gatha Mayer , MD Age: 65 Referring MD:  Date of Birth: 1956/09/30 Gender: Male Account #: [id number] Procedure:                Upper GI endoscopy Indications:              Barrett's esophagus, Follow-up of Barrett's                            esophagus Medicines:                Propofol per Anesthesia, Monitored Anesthesia Care Procedure:                Pre-Anesthesia Assessment:                           - Prior to the procedure, a History and Physical                            was performed, and patient medications and                            allergies were reviewed. The patient's tolerance of                            previous anesthesia was also reviewed. The risks                            and benefits of the procedure and the sedation                            options and risks were discussed with the patient.                            All questions were answered, and informed consent                            was obtained. Prior Anticoagulants: The patient has                            taken no previous anticoagulant or antiplatelet                            agents. ASA Grade Assessment: II - A patient with                            mild systemic disease. After reviewing the risks                            and benefits, the patient was deemed in                            satisfactory condition to undergo the procedure.  After obtaining informed consent, the endoscope was                            passed under direct vision. Throughout the                            procedure, the patient's blood pressure, pulse, and                            oxygen saturations were monitored continuously. The                            Endoscope was introduced through the mouth, and                            advanced to the second part of  duodenum. The upper                            GI endoscopy was accomplished without difficulty.                            The patient tolerated the procedure well. Scope In: Scope Out: Findings:                 There were esophageal mucosal changes secondary to                            established short-segment Barrett's disease present                            in the distal esophagus. The maximum longitudinal                            extent of these mucosal changes was 1 cm in length.                            Mucosa was biopsied with a cold forceps for                            histology in 4 quadrants at intervals of 1 cm. One                            specimen bottle was sent to pathology. Verification                            of patient identification for the specimen was                            done. Estimated blood loss was minimal.                           The gastroesophageal flap valve was visualized  endoscopically and classified as Hill Grade III                            (minimal fold, loose to endoscope, hiatal hernia                            likely).                           The exam was otherwise without abnormality.                           The cardia and gastric fundus were normal on                            retroflexion. Complications:            No immediate complications. Estimated Blood Loss:     Estimated blood loss was minimal. Impression:               - Esophageal mucosal changes secondary to                            established short-segment Barrett's disease.                            Biopsied.                           - Gastroesophageal flap valve classified as Hill                            Grade III (minimal fold, loose to endoscope, hiatal                            hernia likely).                           - The examination was otherwise normal. Recommendation:           - Patient has a contact  number available for                            emergencies. The signs and symptoms of potential                            delayed complications were discussed with the                            patient. Return to normal activities tomorrow.                            Written discharge instructions were provided to the                            patient.                           -  Resume previous diet.                           - Continue present medications.                           - See the other procedure note for documentation of                            additional recommendations.                           - Await pathology results. Gatha Mayer, MD 05/20/2021 3:03:39 PM This report has been signed electronically.

## 2021-05-20 NOTE — Op Note (Signed)
[place] Patient Name: Bryce Cameron Procedure Date: 05/20/2021 2:29 PM MRN: [id number] Endoscopist: Gatha Mayer , MD Age: 65 Referring MD:  Date of Birth: Nov 02, 1955 Gender: Male Account #: [id number] Procedure:                Colonoscopy Indications:              Surveillance: Personal history of adenomatous                            polyps on last colonoscopy > 3 years ago Medicines:                Propofol per Anesthesia, Monitored Anesthesia Care Procedure:                Pre-Anesthesia Assessment:                           - Prior to the procedure, a History and Physical                            was performed, and patient medications and                            allergies were reviewed. The patient's tolerance of                            previous anesthesia was also reviewed. The risks                            and benefits of the procedure and the sedation                            options and risks were discussed with the patient.                            All questions were answered, and informed consent                            was obtained. Prior Anticoagulants: The patient has                            taken no previous anticoagulant or antiplatelet                            agents. ASA Grade Assessment: II - A patient with                            mild systemic disease. After reviewing the risks                            and benefits, the patient was deemed in                            satisfactory condition to undergo the procedure.  After obtaining informed consent, the colonoscope                            was passed under direct vision. Throughout the                            procedure, the patient's blood pressure, pulse, and                            oxygen saturations were monitored continuously. The                            Olympus CF-HQ190L ([id number]) Colonoscope was                             introduced through the anus and advanced to the the                            cecum, identified by appendiceal orifice and                            ileocecal valve. The colonoscopy was performed                            without difficulty. The patient tolerated the                            procedure well. The quality of the bowel                            preparation was good. The ileocecal valve,                            appendiceal orifice, and rectum were photographed. Scope In: 2:43:37 PM Scope Out: 2:51:42 PM Scope Withdrawal Time: 0 hours 6 minutes 41 seconds  Total Procedure Duration: 0 hours 8 minutes 5 seconds  Findings:                 The perianal and digital rectal examinations were                            normal. Pertinent negatives include normal prostate                            (size, shape, and consistency).                           A diminutive polyp was found in the sigmoid colon.                            The polyp was sessile. The polyp was removed with a                            cold snare. Resection and retrieval were complete.  Verification of patient identification for the                            specimen was done. Estimated blood loss was minimal.                           Multiple diverticula were found in the sigmoid                            colon.                           The exam was otherwise without abnormality on                            direct and retroflexion views. Complications:            No immediate complications. Estimated Blood Loss:     Estimated blood loss was minimal. Impression:               - One diminutive polyp in the sigmoid colon,                            removed with a cold snare. Resected and retrieved.                           - Diverticulosis in the sigmoid colon.                           - The examination was otherwise normal on direct                            and  retroflexion views.                           - Personal history of colonic polyps.2009 - single                            adenoma                           04/30/2015 5 polyps 2-6 mm = 3 adenomas and 1 ssp and                            1 mucosal polyp Recommendation:           - Patient has a contact number available for                            emergencies. The signs and symptoms of potential                            delayed complications were discussed with the                            patient. Return to normal activities tomorrow.  Written discharge instructions were provided to the                            patient.                           - Resume previous diet.                           - Continue present medications.                           - Repeat colonoscopy is recommended. The                            colonoscopy date will be determined after pathology                            results from today's exam become available for                            review. Gatha Mayer, MD 05/20/2021 3:06:05 PM This report has been signed electronically.

## 2021-05-20 NOTE — Progress Notes (Signed)
Called to room to assist during endoscopic procedure.  Patient ID and intended procedure confirmed with present staff. Received instructions for my participation in the procedure from the performing physician.  

## 2021-05-20 NOTE — Patient Instructions (Addendum)
The esophagus looks stable I did take some standard biopsies but I doubt we will need to keep checking this.  I will let you know those results.  The stomach and the upper intestine were normal.  I found 1 tiny colon polyp and removed that and you also have diverticulosis as before.   I will let you know pathology results and when to have another routine colonoscopy by mail and/or My Chart.  I appreciate the opportunity to care for this patient. Gatha Mayer, MD, FACG   YOU HAD AN ENDOSCOPIC PROCEDURE TODAY AT [facility]:   Refer to the procedure report that was given to you for any specific questions about what was found during the examination.  If the procedure report does not answer your questions, please call your gastroenterologist to clarify.  If you requested that your care partner not be given the details of your procedure findings, then the procedure report has been included in a sealed envelope for you to review at your convenience later.  YOU SHOULD EXPECT: Some feelings of bloating in the abdomen. Passage of more gas than usual.  Walking can help get rid of the air that was put into your GI tract during the procedure and reduce the bloating. If you had a lower endoscopy (such as a colonoscopy or flexible sigmoidoscopy) you may notice spotting of blood in your stool or on the toilet paper. If you underwent a bowel prep for your procedure, you may not have a normal bowel movement for a few days.  Please Note:  You might notice some irritation and congestion in your nose or some drainage.  This is from the oxygen used during your procedure.  There is no need for concern and it should clear up in a day or so.  SYMPTOMS TO REPORT IMMEDIATELY:  Following lower endoscopy (colonoscopy or flexible sigmoidoscopy):  Excessive amounts of blood in the stool  Significant tenderness or worsening of abdominal pains  Swelling of the abdomen that is new, acute  Fever of 100F or  higher  Following upper endoscopy (EGD)  Vomiting of blood or coffee ground material  New chest pain or pain under the shoulder blades  Painful or persistently difficult swallowing  New shortness of breath  Fever of 100F or higher  Black, tarry-looking stools  For urgent or emergent issues, a gastroenterologist can be reached at any hour by calling [phone]. Do not use MyChart messaging for urgent concerns.    DIET:  We do recommend a small meal at first, but then you may proceed to your regular diet.  Drink plenty of fluids but you should avoid alcoholic beverages for 24 hours.  ACTIVITY:  You should plan to take it easy for the rest of today and you should NOT DRIVE or use heavy machinery until tomorrow (because of the sedation medicines used during the test).    FOLLOW UP: Our staff will call the number listed on your records 48-72 hours following your procedure to check on you and address any questions or concerns that you may have regarding the information given to you following your procedure. If we do not reach you, we will leave a message.  We will attempt to reach you two times.  During this call, we will ask if you have developed any symptoms of COVID 19. If you develop any symptoms (ie: fever, flu-like symptoms, shortness of breath, cough etc.) before then, please call [phone].  If you test positive for  Covid 19 in the 2 weeks post procedure, please call and report this information to [place].    If any biopsies were taken you will be contacted by phone or by letter within the next 1-3 weeks.  Please call us at [phone] if you have not heard about the biopsies in 3 weeks.    SIGNATURES/CONFIDENTIALITY: You and/or your care partner have signed paperwork which will be entered into your electronic medical record.  These signatures attest to the fact that that the information above on your After Visit Summary has been reviewed and is understood.  Full responsibility of  the confidentiality of this discharge information lies with you and/or your care-partner.

## 2021-05-20 NOTE — Progress Notes (Signed)
Pt's states no medical or surgical changes since previsit or office visit. 

## 2021-05-20 NOTE — Progress Notes (Signed)
1429 Robinul 0.1 mg IV given due large amount of secretions upon assessment.  MD made aware, vss  

## 2021-05-24 ENCOUNTER — Telehealth: Payer: Self-pay

## 2021-05-24 NOTE — Telephone Encounter (Signed)
  Follow up Call-  Call back number 05/20/2021  Post procedure Call Back phone  # [phone]  Permission to leave phone message Yes  Some recent data might be hidden     Patient questions:  Do you have a fever, pain , or abdominal swelling? No. Pain Score  0 *  Have you tolerated food without any problems? Yes.    Have you been able to return to your normal activities? Yes.    Do you have any questions about your discharge instructions: Diet   No. Medications  No. Follow up visit  No.  Do you have questions or concerns about your Care? No.  Actions: * If pain score is 4 or above: No action needed, pain <4. Have you developed a fever since your procedure? no  2.   Have you had an respiratory symptoms (SOB or cough) since your procedure? no  3.   Have you tested positive for COVID 19 since your procedure no  4.   Have you had any family members/close contacts diagnosed with the COVID 19 since your procedure?  no   If yes to any of these questions please route to Joylene John, RN and Joella Prince, RN

## 2021-05-29 ENCOUNTER — Other Ambulatory Visit: Payer: Self-pay | Admitting: Family Medicine

## 2021-05-29 DIAGNOSIS — N401 Enlarged prostate with lower urinary tract symptoms: Secondary | ICD-10-CM

## 2021-06-06 ENCOUNTER — Encounter: Payer: Self-pay | Admitting: Internal Medicine

## 2021-07-14 ENCOUNTER — Other Ambulatory Visit: Payer: Self-pay

## 2021-07-14 ENCOUNTER — Ambulatory Visit: Payer: Medicare PPO | Admitting: Family Medicine

## 2021-07-14 VITALS — BP 100/70 | HR 76 | Temp 97.0°F | Ht 68.2 in | Wt 173.0 lb

## 2021-07-14 DIAGNOSIS — G6289 Other specified polyneuropathies: Secondary | ICD-10-CM | POA: Diagnosis not present

## 2021-07-14 DIAGNOSIS — E782 Mixed hyperlipidemia: Secondary | ICD-10-CM

## 2021-07-14 DIAGNOSIS — L720 Epidermal cyst: Secondary | ICD-10-CM

## 2021-07-14 LAB — LIPID PANEL
Cholesterol: 180 mg/dL (ref 0–200)
HDL: 34 mg/dL — ABNORMAL LOW (ref >39.00)
LDL Cholesterol: 110 mg/dL — ABNORMAL HIGH (ref 0–99)
NonHDL: 146.05
Total CHOL/HDL Ratio: 5
Triglycerides: 178 mg/dL — ABNORMAL HIGH (ref 0.0–149.0)
VLDL: 35.6 mg/dL (ref 0.0–40.0)

## 2021-07-14 NOTE — Assessment & Plan Note (Signed)
Etiology unclear and persistent symptoms with some numbness as well. No improvement with 300 mg gabapentin. Will refer to neurology for further work-up evaluation

## 2021-07-14 NOTE — Progress Notes (Signed)
Subjective:     Bryce Cameron is a 65 y.o. male presenting for Ear Problem ("Cyst" on outside of L ear. Pt drained it. No pain now)     HPI  #Ear cyst - started 6 months ago - was bothersome - had been having some balance issues - minimal swelling for the first few weeks - but then started having some poor sleep due to pain - 2 weeks ago had swelling for 2 days - he put alcohol and disinfection - made a small incision and drained it out - yellow pus - did a Band-Aid for a week - started amoxicillin due to tooth ache for 2 days - no drainage since opening but still has the cyst present -   #burning in feet - improved with activity - no improvement with gabapentin - no change   Review of Systems   Social History   Tobacco Use  Smoking Status Some Days   Types: Cigars   Last attempt to quit: 11/12/2011   Years since quitting: 9.6  Smokeless Tobacco Never  Tobacco Comments   maybe a cigar when golfing        Objective:    BP Readings from Last 3 Encounters:  07/14/21 100/70  05/20/21 117/76  02/21/21 118/62   Wt Readings from Last 3 Encounters:  07/14/21 173 lb (78.5 kg)  05/20/21 178 lb (80.7 kg)  05/09/21 178 lb (80.7 kg)    BP 100/70   Pulse 76   Temp (!) 97 F (36.1 C) (Temporal)   Ht 5' 8.2" (1.732 m)   Wt 173 lb (78.5 kg)   SpO2 95%   BMI 26.15 kg/m    Physical Exam Constitutional:      Appearance: Normal appearance. He is not ill-appearing or diaphoretic.  HENT:     Right Ear: External ear normal.     Left Ear: External ear normal.  Eyes:     General: No scleral icterus.    Extraocular Movements: Extraocular movements intact.     Conjunctiva/sclera: Conjunctivae normal.  Cardiovascular:     Rate and Rhythm: Normal rate.  Pulmonary:     Effort: Pulmonary effort is normal.  Musculoskeletal:     Cervical back: Neck supple.  Skin:    General: Skin is warm and dry.     Comments: Left ear side anterior to the tragus: healing scab  from opening overlaying nodule which is <5 mm in size, no erythema or tenderness to palpation  Neurological:     Mental Status: He is alert. Mental status is at baseline.  Psychiatric:        Mood and Affect: Mood normal.          Assessment & Plan:   Problem List Items Addressed This Visit       Nervous and Auditory   Peripheral neuropathy    Etiology unclear and persistent symptoms with some numbness as well. No improvement with 300 mg gabapentin. Will refer to neurology for further work-up evaluation      Relevant Orders   Ambulatory referral to Neurology   Epidermoid cyst of ear - Primary    Hx seems consistent with infected cyst. Pt interested in removal to prevent recurrence. No sign of infection today. Referral to dermatology placed.       Relevant Orders   Ambulatory referral to Dermatology     Other   Mixed hyperlipidemia    Repeat lipids. Cont simvastatin 10 mg      Relevant Orders  Lipid panel     Return if symptoms worsen or fail to improve.  Lesleigh Noe, MD  This visit occurred during the SARS-CoV-2 public health emergency.  Safety protocols were in place, including screening questions prior to the visit, additional usage of staff PPE, and extensive cleaning of exam room while observing appropriate contact time as indicated for disinfecting solutions.

## 2021-07-14 NOTE — Patient Instructions (Signed)
#  Referral I have placed a referral to a specialist for you. You should receive a phone call from the specialty office. Make sure your voicemail is not full and that if you are able to answer your phone to unknown or new numbers.   It may take up to 2 weeks to hear about the referral. If you do not hear anything in 2 weeks, please call our office and ask to speak with the referral coordinator.   - Dermatology - Neurology   Labs today

## 2021-07-14 NOTE — Assessment & Plan Note (Signed)
Repeat lipids. Cont simvastatin 10 mg

## 2021-07-14 NOTE — Assessment & Plan Note (Signed)
Hx seems consistent with infected cyst. Pt interested in removal to prevent recurrence. No sign of infection today. Referral to dermatology placed.

## 2021-08-02 ENCOUNTER — Encounter: Payer: Self-pay | Admitting: Neurology

## 2021-08-02 ENCOUNTER — Ambulatory Visit: Payer: Medicare PPO | Admitting: Neurology

## 2021-08-02 ENCOUNTER — Other Ambulatory Visit: Payer: Self-pay

## 2021-08-02 VITALS — BP 101/71 | HR 71 | Ht 68.0 in | Wt 173.0 lb

## 2021-08-02 DIAGNOSIS — M5416 Radiculopathy, lumbar region: Secondary | ICD-10-CM | POA: Diagnosis not present

## 2021-08-02 DIAGNOSIS — G629 Polyneuropathy, unspecified: Secondary | ICD-10-CM

## 2021-08-02 MED ORDER — PREGABALIN 50 MG PO CAPS: 50.0000 mg | ORAL_CAPSULE | Freq: Every day | ORAL | 0 refills | Status: DC

## 2021-08-02 NOTE — Patient Instructions (Signed)
Lumbar spine MRI  Trial of Pregabalin 50 mg nightly  Return to clinic in 3 months

## 2021-08-02 NOTE — Progress Notes (Signed)
[facility]  PATIENT: Bryce Cameron DOB: 01-Sep-1956  REFERRING CLINICIAN: Lesleigh Noe, MD HISTORY FROM: Patient  REASON FOR VISIT: Bilateral feet numbness and shooting pain from the back to both legs.    HISTORICAL  CHIEF COMPLAINT:  Chief Complaint  Patient presents with   New Patient (Initial Visit)    Rm 13, alone, here to discuss neuropathy in both feet     HISTORY OF PRESENT ILLNESS:  This is a 65 year old gentleman with PMHx of HLD, GERD, Gout and DJD who is presenting with c/o bilateral feet numbness and intermittent low back pain shooting down to both legs for the past 3  years. Patient states the symptoms started 3 years started with severe pain in tailbone, after driving for a extended period of time. Pain was described as shooting pain into both legs to feet. He reports that pain is intermittent since COVID that the fact that he has not exercise regularly.  Tingling in both feet is constant and most of the night to the point when he is up all night long. Started Gabapentin for the past 3 weeks, didn't work and makes him feel weird so he discontinued it A week ago, girlfriend gave him xanax, shooting pain resolved but numbness is still present,  Shooting pain improved in the psat 4 weeks. He denies any history of back trauma. States that he works for [employer] and drives a lot. He used to be a Microbiologist, had previous concussion, and multiple joint injuries.    OTHER MEDICAL CONDITIONS: HLD, DJD, GERD Gout.    REVIEW OF SYSTEMS: Full 14 system review of systems performed and negative with exception of: as noted in the HPI  ALLERGIES: No Known Allergies  HOME MEDICATIONS: Outpatient Medications Prior to Visit  Medication Sig Dispense Refill   APPLE CIDER VINEGAR PO Take by mouth.     Ascorbic Acid (VITAMIN C) 1000 MG tablet Take 1,000 mg by mouth daily.     Calcium Carbonate-Vit D-Min (CALCIUM 1200 PO) Take by mouth.      cyanocobalamin 100 MCG tablet Take 100 mcg by mouth daily.     esomeprazole (NEXIUM) 20 MG capsule Take 20 mg by mouth. Every 3 days     magnesium 30 MG tablet Take 30 mg by mouth 2 (two) times daily.     Niacin (VITAMIN B-3 PO) Take by mouth.     simvastatin (ZOCOR) 10 MG tablet Take 1 tablet (10 mg total) by mouth at bedtime. 90 tablet 2   tamsulosin (FLOMAX) 0.4 MG CAPS capsule TAKE 1 CAPSULE BY MOUTH EVERY DAY 90 capsule 1   Zinc Sulfate (ZINC 15 PO) Take by mouth.     No facility-administered medications prior to visit.    PAST MEDICAL HISTORY: Past Medical History:  Diagnosis Date   Barrett's esophagus 03/13/2008   short and biopsies 2022 without intestinal metaplasia so resolved, plus original diagnosis made on less than 1 cm of change   DJD (degenerative joint disease)    GERD (gastroesophageal reflux disease)    Gout 2016   diagnosed 2016   Hx of adenomatous polyp of colon 03/13/2008   Hyperlipidemia    Wears glasses     PAST SURGICAL HISTORY: Past Surgical History:  Procedure Laterality Date   COLONOSCOPY  2016   TA, CG   ELBOW ARTHROSCOPY Left 11/14/2013   Procedure: ARTHROSCOPY LEFT ELBOW, SYNOVECTOMY, DEBRIDEMENT, REMOVAL OF LOOSE BODIES;  Surgeon: Ninetta Lights, MD;  Location: [facility];  Service: Orthopedics;  Laterality: Left;   KNEE ARTHROPLASTY  2008   right   KNEE ARTHROSCOPY  2012   left   NASAL POLYP EXCISION     SHOULDER ARTHROSCOPY  2011   left   SHOULDER ARTHROSCOPY W/ ROTATOR CUFF REPAIR  2008   right   SHOULDER INJECTION Left 11/14/2013   Procedure: SHOULDER INJECTION;  Surgeon: Ninetta Lights, MD;  Location: [place];  Service: Orthopedics;  Laterality: Left;   TONSILLECTOMY     UPPER GASTROINTESTINAL ENDOSCOPY     VASECTOMY      FAMILY HISTORY: Family History  Problem Relation Age of Onset   Colon polyps Mother    Colon cancer Mother 34   Lung cancer Father    Heart disease Brother 30       had  bypass surgery   Esophageal cancer Neg Hx    Rectal cancer Neg Hx    Stomach cancer Neg Hx     SOCIAL HISTORY: Social History   Socioeconomic History   Marital status: Divorced    Spouse name: Not on file   Number of children: 3   Years of education: college   Highest education level: Not on file  Occupational History   Not on file  Tobacco Use   Smoking status: Some Days    Types: Cigars    Last attempt to quit: 11/12/2011    Years since quitting: 9.7   Smokeless tobacco: Never   Tobacco comments:    maybe a cigar when golfing  Vaping Use   Vaping Use: Never used  Substance and Sexual Activity   Alcohol use: Yes    Alcohol/week: 0.0 standard drinks    Comment: rarely, beer once a month   Drug use: Never   Sexual activity: Yes    Birth control/protection: None  Other Topics Concern   Not on file  Social History Narrative   01/06/20   From: born in [place], but in [place] since 1989   Living: with son [name]   Work: was working with [employer] - has been unemployed since [employer]      Family: has 3 children (son [name], Radiation protection practitioner; daughter - died, one adopted child      Enjoys: play golf and soccer (but has not been playing since Covid)      Exercise: golf, would like to go back to soccer, exercising occasionally   Diet: eats responsibly, cooks at home      Safety   Seat belts: Yes    Guns: Yes  and secure   Safe in relationships: Yes    Social Determinants of Health   Financial Resource Strain: Low Risk    Difficulty of Paying Living Expenses: Not hard at all  Food Insecurity: No Food Insecurity   Worried About Charity fundraiser in the Last Year: Never true   Arboriculturist in the Last Year: Never true  Transportation Needs: No Transportation Needs   Lack of Transportation (Medical): No   Lack of Transportation (Non-Medical): No  Physical Activity: Sufficiently Active   Days of Exercise per Week: 4 days   Minutes of Exercise per Session: 60 min  Stress: No Stress  Concern Present   Feeling of Stress : Not at all  Social Connections: Not on file  Intimate Partner Violence: Not At Risk   Fear of Current or Ex-Partner: No   Emotionally Abused: No   Physically Abused: No  Sexually Abused: No     PHYSICAL EXAM  GENERAL EXAM/CONSTITUTIONAL: Vitals:  Vitals:   08/02/21 1124  BP: 101/71  Pulse: 71  Weight: 173 lb (78.5 kg)  Height: 5\' 8"  (1.727 m)   Body mass index is 26.3 kg/m. Wt Readings from Last 3 Encounters:  08/02/21 173 lb (78.5 kg)  07/14/21 173 lb (78.5 kg)  05/20/21 178 lb (80.7 kg)   Patient is in no distress; well developed, nourished and groomed; neck is supple  EYES: Pupils round and reactive to light, Visual fields full to confrontation, Extraocular movements intacts,   MUSCULOSKELETAL: Gait, strength, tone, movements noted in Neurologic exam below  NEUROLOGIC: MENTAL STATUS:  MMSE - [place] Exam 02/16/2021  Not completed: Refused   awake, alert, oriented to person, place and time recent and remote memory intact normal attention and concentration language fluent, comprehension intact, naming intact fund of knowledge appropriate  CRANIAL NERVE:  2nd, 3rd, 4th, 6th - pupils equal and reactive to light, visual fields full to confrontation, extraocular muscles intact, no nystagmus 5th - facial sensation symmetric 7th - facial strength symmetric 8th - hearing intact 9th - palate elevates symmetrically, uvula midline 11th - shoulder shrug symmetric 12th - tongue protrusion midline  MOTOR:  normal bulk and tone, full strength in the BUE, BLE  SENSORY:  normal and symmetric to light touch, pinprick, vibration in the upper extremities and decrease vibration and pinprick in the lower extremities up to mid shin.   COORDINATION:  finger-nose-finger, fine finger movements normal  REFLEXES:  deep tendon reflexes present and symmetric  GAIT/STATION:  normal    DIAGNOSTIC DATA (LABS, IMAGING,  TESTING) - I reviewed patient records, labs, notes, testing and imaging myself where available.  Lab Results  Component Value Date   HGB 15.4 11/14/2013      Component Value Date/Time   NA 140 01/13/2021 1011   K 5.0 01/13/2021 1011   CL 102 01/13/2021 1011   CO2 28 01/13/2021 1011   GLUCOSE 80 01/13/2021 1011   BUN 15 01/13/2021 1011   CREATININE 0.75 01/13/2021 1011   CALCIUM 10.0 01/13/2021 1011   PROT 7.5 01/13/2021 1011   ALBUMIN 4.3 01/13/2021 1011   AST 33 01/13/2021 1011   ALT 45 01/13/2021 1011   ALKPHOS 86 01/13/2021 1011   BILITOT 0.4 01/13/2021 1011   GFRNONAA 98.99 06/10/2010 1025   Lab Results  Component Value Date   CHOL 180 07/14/2021   HDL 34.00 (L) 07/14/2021   LDLCALC 110 (H) 07/14/2021   LDLDIRECT 173.0 01/13/2021   TRIG 178.0 (H) 07/14/2021   CHOLHDL 5 07/14/2021   Lab Results  Component Value Date   HGBA1C 5.9 01/13/2021   Lab Results  Component Value Date   TIRWERXV40 086 01/13/2021   Lab Results  Component Value Date   TSH 1.46 01/13/2021     ASSESSMENT AND PLAN  65 y.o. year old male with past medical history of hyperlipidemia, DJD, gout and GERD who is presenting with 3-year history of intermittent low back pain, described as shooting pain to both legs and constant bilateral feet numbness.  Patient has tried gabapentin for numbness but reports side effect of "feeling weird" and he self discontinued.  Shooting pain starting from the low back improved in the past 4 weeks.  On exam he does have decreased sensation to pinprick and vibration up to mid shin consistent with peripheral neuropathy.  He does not have any history of diabetes, no hypertension, his B12 was  normal at 769 and his TSH was normal at 1.46.  I will order a lumbar spine MRI to rule out any lumbar stenosis or lumbar radiculopathy that can explain the shooting pain and also the numbness in both feet.  I will see the patient in 3 months for follow-up, I will also call him to go  over the MRI result.   1. Neuropathy   2. Lumbar radiculopathy     PLAN: Lumbar spine MRI  Trial of Pregabalin 50 mg nightly  Return to clinic in 3 months   Orders Placed This Encounter  Procedures   MR LUMBAR SPINE WO CONTRAST    Meds ordered this encounter  Medications   pregabalin (LYRICA) 50 MG capsule    Sig: Take 1 capsule (50 mg total) by mouth daily.    Dispense:  30 capsule    Refill:  0    Return in about 3 months (around 11/02/2021).    Alric Ran, MD 08/02/2021, 11:50 AM  [facility] [address], [place], [place] [address] [phone]

## 2021-08-10 DIAGNOSIS — L720 Epidermal cyst: Secondary | ICD-10-CM | POA: Diagnosis not present

## 2021-08-10 DIAGNOSIS — D225 Melanocytic nevi of trunk: Secondary | ICD-10-CM | POA: Diagnosis not present

## 2021-08-10 DIAGNOSIS — D2261 Melanocytic nevi of right upper limb, including shoulder: Secondary | ICD-10-CM | POA: Diagnosis not present

## 2021-08-10 DIAGNOSIS — D485 Neoplasm of uncertain behavior of skin: Secondary | ICD-10-CM | POA: Diagnosis not present

## 2021-08-10 DIAGNOSIS — D1801 Hemangioma of skin and subcutaneous tissue: Secondary | ICD-10-CM | POA: Diagnosis not present

## 2021-08-10 DIAGNOSIS — Z1283 Encounter for screening for malignant neoplasm of skin: Secondary | ICD-10-CM | POA: Diagnosis not present

## 2021-08-10 DIAGNOSIS — D1809 Hemangioma of other sites: Secondary | ICD-10-CM | POA: Diagnosis not present

## 2021-08-12 ENCOUNTER — Other Ambulatory Visit: Payer: Medicare PPO

## 2021-08-13 ENCOUNTER — Ambulatory Visit
Admission: RE | Admit: 2021-08-13 | Discharge: 2021-08-13 | Disposition: A | Discharge status: Home / Self Care | Payer: Medicare PPO | Source: Ambulatory Visit | Attending: Neurology | Admitting: Neurology

## 2021-08-13 ENCOUNTER — Other Ambulatory Visit: Payer: Self-pay

## 2021-08-13 DIAGNOSIS — G629 Polyneuropathy, unspecified: Secondary | ICD-10-CM

## 2021-08-13 DIAGNOSIS — M5416 Radiculopathy, lumbar region: Secondary | ICD-10-CM

## 2021-08-30 DIAGNOSIS — C44319 Basal cell carcinoma of skin of other parts of face: Secondary | ICD-10-CM | POA: Diagnosis not present

## 2021-08-30 DIAGNOSIS — D485 Neoplasm of uncertain behavior of skin: Secondary | ICD-10-CM | POA: Diagnosis not present

## 2021-08-30 DIAGNOSIS — L905 Scar conditions and fibrosis of skin: Secondary | ICD-10-CM | POA: Diagnosis not present

## 2021-09-21 ENCOUNTER — Ambulatory Visit: Payer: Self-pay | Admitting: Neurology

## 2021-12-08 ENCOUNTER — Other Ambulatory Visit: Payer: Self-pay | Admitting: Family Medicine

## 2021-12-08 DIAGNOSIS — N401 Enlarged prostate with lower urinary tract symptoms: Secondary | ICD-10-CM

## 2021-12-16 ENCOUNTER — Other Ambulatory Visit: Payer: Self-pay | Admitting: *Deleted

## 2021-12-16 DIAGNOSIS — E782 Mixed hyperlipidemia: Secondary | ICD-10-CM

## 2021-12-16 MED ORDER — SIMVASTATIN 10 MG PO TABS
10.0000 mg | ORAL_TABLET | Freq: Every day | ORAL | 2 refills | Status: DC
Start: 2021-12-16 — End: 2023-01-10

## 2021-12-21 ENCOUNTER — Other Ambulatory Visit: Payer: Self-pay | Admitting: Family Medicine

## 2021-12-21 DIAGNOSIS — R3911 Hesitancy of micturition: Secondary | ICD-10-CM

## 2021-12-21 DIAGNOSIS — N401 Enlarged prostate with lower urinary tract symptoms: Secondary | ICD-10-CM

## 2022-02-17 ENCOUNTER — Ambulatory Visit: Payer: Medicare PPO

## 2022-02-17 ENCOUNTER — Ambulatory Visit (INDEPENDENT_AMBULATORY_CARE_PROVIDER_SITE_OTHER): Payer: Medicare PPO

## 2022-02-17 VITALS — Ht 68.0 in | Wt 173.0 lb

## 2022-02-17 DIAGNOSIS — Z0000 Encounter for general adult medical examination without abnormal findings: Secondary | ICD-10-CM

## 2022-02-17 NOTE — Patient Instructions (Addendum)
?[name] , ?Thank you for taking time to come for your Medicare Wellness Visit. I appreciate your ongoing commitment to your health goals. Please review the following plan we discussed and let me know if I can assist you in the future.  ? ?These are the goals we discussed: ? Goals   ? ?   Increase physical activity (pt-stated)   ?   Continue to play Golf. ?  ?   Patient Stated   ?   02/16/2021, I will continue to play golf 4 days a week for 4 hours.  ?  ? ?  ?  ?This is a list of the screening recommended for you and due dates:  ?Health Maintenance  ?Topic Date Due  ? COVID-19 Vaccine (5 - Booster for Pfizer series) 03/05/2022*  ? Zoster (Shingles) Vaccine (1 of 2) 05/19/2022*  ? Pneumonia Vaccine (1 - PCV) 02/18/2023*  ? Tetanus Vaccine  02/17/2024*  ? Flu Shot  05/30/2022  ? Colon Cancer Screening  05/20/2028  ? Hepatitis C Screening: USPSTF Recommendation to screen - Ages 24-79 yo.  Completed  ? HPV Vaccine  Aged Out  ?*Topic was postponed. The date shown is not the original due date.  ?  ?Advanced directives: No Patient deffered ? ?Conditions/risks identified: None ? ?Next appointment: Follow up in one year for your annual wellness visit.  ? ? ?Preventive Care 2 Years and Older, Male ?Preventive care refers to lifestyle choices and visits with your health care provider that can promote health and wellness. ?What does preventive care include? ?A yearly physical exam. This is also called an annual well check. ?Dental exams once or twice a year. ?Routine eye exams. Ask your health care provider how often you should have your eyes checked. ?Personal lifestyle choices, including: ?Daily care of your teeth and gums. ?Regular physical activity. ?Eating a healthy diet. ?Avoiding tobacco and drug use. ?Limiting alcohol use. ?Practicing safe sex. ?Taking low doses of aspirin every day. ?Taking vitamin and mineral supplements as recommended by your health care provider. ?What happens during an annual well check? ?The  services and screenings done by your health care provider during your annual well check will depend on your age, overall health, lifestyle risk factors, and family history of disease. ?Counseling  ?Your health care provider may ask you questions about your: ?Alcohol use. ?Tobacco use. ?Drug use. ?Emotional well-being. ?Home and relationship well-being. ?Sexual activity. ?Eating habits. ?History of falls. ?Memory and ability to understand (cognition). ?Work and work Statistician. ?Screening  ?You may have the following tests or measurements: ?Height, weight, and BMI. ?Blood pressure. ?Lipid and cholesterol levels. These may be checked every 5 years, or more frequently if you are over 43 years old. ?Skin check. ?Lung cancer screening. You may have this screening every year starting at age 67 if you have a 30-pack-year history of smoking and currently smoke or have quit within the past 15 years. ?Fecal occult blood test (FOBT) of the stool. You may have this test every year starting at age 23. ?Flexible sigmoidoscopy or colonoscopy. You may have a sigmoidoscopy every 5 years or a colonoscopy every 10 years starting at age 23. ?Prostate cancer screening. Recommendations will vary depending on your family history and other risks. ?Hepatitis C blood test. ?Hepatitis B blood test. ?Sexually transmitted disease (STD) testing. ?Diabetes screening. This is done by checking your blood sugar (glucose) after you have not eaten for a while (fasting). You may have this done every 1-3 years. ?Abdominal aortic aneurysm (  AAA) screening. You may need this if you are a current or former smoker. ?Osteoporosis. You may be screened starting at age 80 if you are at high risk. ?Talk with your health care provider about your test results, treatment options, and if necessary, the need for more tests. ?Vaccines  ?Your health care provider may recommend certain vaccines, such as: ?Influenza vaccine. This is recommended every year. ?Tetanus,  diphtheria, and acellular pertussis (Tdap, Td) vaccine. You may need a Td booster every 10 years. ?Zoster vaccine. You may need this after age 10. ?Pneumococcal 13-valent conjugate (PCV13) vaccine. One dose is recommended after age 28. ?Pneumococcal polysaccharide (PPSV23) vaccine. One dose is recommended after age 5. ?Talk to your health care provider about which screenings and vaccines you need and how often you need them. ?This information is not intended to replace advice given to you by your health care provider. Make sure you discuss any questions you have with your health care provider. ?Document Released: 11/12/2015 Document Revised: 07/05/2016 Document Reviewed: 08/17/2015 ?Elsevier Interactive Patient Education ? 2017 [place]. ? ?Fall Prevention in the Home ?Falls can cause injuries. They can happen to people of all ages. There are many things you can do to make your home safe and to help prevent falls. ?What can I do on the outside of my home? ?Regularly fix the edges of walkways and driveways and fix any cracks. ?Remove anything that might make you trip as you walk through a door, such as a raised step or threshold. ?Trim any bushes or trees on the path to your home. ?Use bright outdoor lighting. ?Clear any walking paths of anything that might make someone trip, such as rocks or tools. ?Regularly check to see if handrails are loose or broken. Make sure that both sides of any steps have handrails. ?Any raised decks and porches should have guardrails on the edges. ?Have any leaves, snow, or ice cleared regularly. ?Use sand or salt on walking paths during winter. ?Clean up any spills in your garage right away. This includes oil or grease spills. ?What can I do in the bathroom? ?Use night lights. ?Install grab bars by the toilet and in the tub and shower. Do not use towel bars as grab bars. ?Use non-skid mats or decals in the tub or shower. ?If you need to sit down in the shower, use a plastic,  non-slip stool. ?Keep the floor dry. Clean up any water that spills on the floor as soon as it happens. ?Remove soap buildup in the tub or shower regularly. ?Attach bath mats securely with double-sided non-slip rug tape. ?Do not have throw rugs and other things on the floor that can make you trip. ?What can I do in the bedroom? ?Use night lights. ?Make sure that you have a light by your bed that is easy to reach. ?Do not use any sheets or blankets that are too big for your bed. They should not hang down onto the floor. ?Have a firm chair that has side arms. You can use this for support while you get dressed. ?Do not have throw rugs and other things on the floor that can make you trip. ?What can I do in the kitchen? ?Clean up any spills right away. ?Avoid walking on wet floors. ?Keep items that you use a lot in easy-to-reach places. ?If you need to reach something above you, use a strong step stool that has a grab bar. ?Keep electrical cords out of the way. ?Do not use floor polish  or wax that makes floors slippery. If you must use wax, use non-skid floor wax. ?Do not have throw rugs and other things on the floor that can make you trip. ?What can I do with my stairs? ?Do not leave any items on the stairs. ?Make sure that there are handrails on both sides of the stairs and use them. Fix handrails that are broken or loose. Make sure that handrails are as long as the stairways. ?Check any carpeting to make sure that it is firmly attached to the stairs. Fix any carpet that is loose or worn. ?Avoid having throw rugs at the top or bottom of the stairs. If you do have throw rugs, attach them to the floor with carpet tape. ?Make sure that you have a light switch at the top of the stairs and the bottom of the stairs. If you do not have them, ask someone to add them for you. ?What else can I do to help prevent falls? ?Wear shoes that: ?Do not have high heels. ?Have rubber bottoms. ?Are comfortable and fit you well. ?Are closed  at the toe. Do not wear sandals. ?If you use a stepladder: ?Make sure that it is fully opened. Do not climb a closed stepladder. ?Make sure that both sides of the stepladder are locked into place. ?Ask someone

## 2022-02-17 NOTE — Progress Notes (Addendum)
? ?Subjective:  ? Bryce Cameron is a 66 y.o. male who presents for Medicare Annual/Subsequent preventive examination. ? ?Review of Systems    ?Virtual Visit via Telephone Note ? ?I connected with  [name] on 02/17/22 at  9:30 AM EDT by telephone and verified that I am speaking with the correct person using two identifiers. ? ?Location: ?Patient: Home ?Provider: Office ?Persons participating in the virtual visit: patient/Nurse Health Advisor ?  ?I discussed the limitations, risks, security and privacy concerns of performing an evaluation and management service by telephone and the availability of in person appointments. The patient expressed understanding and agreed to proceed. ? ?Interactive audio and video telecommunications were attempted between this nurse and patient, however failed, due to patient having technical difficulties OR patient did not have access to video capability.  We continued and completed visit with audio only. ? ?Some vital signs may be absent or patient reported.  ? ?[name], LPN  ?Cardiac Risk Factors include: advanced age (>31mn, >>40women);male gender;smoking/ tobacco exposure ? ?   ?Objective:  ?  ?Today's Vitals  ? 02/17/22 0934  ?Weight: 173 lb (78.5 kg)  ?Height: '5\' 8"'$  (1.727 m)  ? ?Body mass index is 26.3 kg/m?. ? ? ?  02/17/2022  ?  9:42 AM 02/16/2021  ?  9:40 AM 04/30/2015  ?  7:58 AM 04/15/2015  ?  9:02 AM 11/11/2013  ? 12:44 PM  ?Advanced Directives  ?Does Patient Have a Medical Advance Directive? No No No No Patient has advance directive, copy not in chart  ?Does patient want to make changes to medical advance directive? No - Patient declined      ?Copy of [place]? No - copy requested      ?Would patient like information on creating a medical advance directive? No - Patient declined No - Patient declined No - patient declined information    ? ? ?Current Medications (verified) ?Outpatient Encounter Medications as of 02/17/2022  ?Medication Sig   ? APPLE CIDER VINEGAR PO Take by mouth.  ? Ascorbic Acid (VITAMIN C) 1000 MG tablet Take 1,000 mg by mouth daily.  ? Calcium Carbonate-Vit D-Min (CALCIUM 1200 PO) Take by mouth.  ? cyanocobalamin 100 MCG tablet Take 100 mcg by mouth daily.  ? esomeprazole (NEXIUM) 20 MG capsule Take 20 mg by mouth. Every 3 days  ? magnesium 30 MG tablet Take 30 mg by mouth 2 (two) times daily.  ? Niacin (VITAMIN B-3 PO) Take by mouth.  ? pregabalin (LYRICA) 50 MG capsule Take 1 capsule (50 mg total) by mouth daily.  ? simvastatin (ZOCOR) 10 MG tablet Take 1 tablet (10 mg total) by mouth at bedtime.  ? tamsulosin (FLOMAX) 0.4 MG CAPS capsule TAKE 1 CAPSULE EVERY DAY  ? Zinc Sulfate (ZINC 15 PO) Take by mouth.  ? ?No facility-administered encounter medications on file as of 02/17/2022.  ? ? ?Allergies (verified) ?Patient has no known allergies.  ? ?History: ?Past Medical History:  ?Diagnosis Date  ? Barrett's esophagus 03/13/2008  ? short and biopsies 2022 without intestinal metaplasia so resolved, plus original diagnosis made on less than 1 cm of change  ? DJD (degenerative joint disease)   ? GERD (gastroesophageal reflux disease)   ? Gout 2016  ? diagnosed 2016  ? Hx of adenomatous polyp of colon 03/13/2008  ? Hyperlipidemia   ? Wears glasses   ? ?Past Surgical History:  ?Procedure Laterality Date  ? COLONOSCOPY  2016  ?  TA, CG  ? ELBOW ARTHROSCOPY Left 11/14/2013  ? Procedure: ARTHROSCOPY LEFT ELBOW, SYNOVECTOMY, DEBRIDEMENT, REMOVAL OF LOOSE BODIES;  Surgeon: Ninetta Lights, MD;  Location: [facility];  Service: Orthopedics;  Laterality: Left;  ? KNEE ARTHROPLASTY  2008  ? right  ? KNEE ARTHROSCOPY  2012  ? left  ? NASAL POLYP EXCISION    ? SHOULDER ARTHROSCOPY  2011  ? left  ? SHOULDER ARTHROSCOPY W/ ROTATOR CUFF REPAIR  2008  ? right  ? SHOULDER INJECTION Left 11/14/2013  ? Procedure: SHOULDER INJECTION;  Surgeon: Ninetta Lights, MD;  Location: [facility];  Service: Orthopedics;  Laterality: Left;   ? TONSILLECTOMY    ? UPPER GASTROINTESTINAL ENDOSCOPY    ? VASECTOMY    ? ?Family History  ?Problem Relation Age of Onset  ? Colon polyps Mother   ? Colon cancer Mother 39  ? Lung cancer Father   ? Heart disease Brother 54  ?     had bypass surgery  ? Esophageal cancer Neg Hx   ? Rectal cancer Neg Hx   ? Stomach cancer Neg Hx   ? ?Social History  ? ?Socioeconomic History  ? Marital status: Divorced  ?  Spouse name: Not on file  ? Number of children: 3  ? Years of education: college  ? Highest education level: Not on file  ?Occupational History  ? Not on file  ?Tobacco Use  ? Smoking status: Some Days  ?  Types: Cigars  ?  Last attempt to quit: 11/12/2011  ?  Years since quitting: 10.2  ? Smokeless tobacco: Never  ? Tobacco comments:  ?  maybe a cigar when golfing  ?Vaping Use  ? Vaping Use: Never used  ?Substance and Sexual Activity  ? Alcohol use: Yes  ?  Alcohol/week: 0.0 standard drinks  ?  Comment: rarely, beer once a month  ? Drug use: Never  ? Sexual activity: Yes  ?  Birth control/protection: None  ?Other Topics Concern  ? Not on file  ?Social History Narrative  ? 01/06/20  ? From: born in [place], but in [place] since 1989  ? Living: with son Bryce Cameron  ? Work: was working with [employer] - has been unemployed since [employer]  ?   ? Family: has 3 children (son Bryce Cameron, Radiation protection practitioner; daughter - died, one adopted child  ?   ? Enjoys: play golf and soccer (but has not been playing since Covid)  ?   ? Exercise: golf, would like to go back to soccer, exercising occasionally  ? Diet: eats responsibly, cooks at home  ?   ? Safety  ? Seat belts: Yes   ? Guns: Yes  and secure  ? Safe in relationships: Yes   ? ?Social Determinants of Health  ? ?Financial Resource Strain: Low Risk   ? Difficulty of Paying Living Expenses: Not hard at all  ?Food Insecurity: No Food Insecurity  ? Worried About Charity fundraiser in the Last Year: Never true  ? Ran Out of Food in the Last Year: Never true  ?Transportation Needs: No Transportation Needs  ? Lack  of Transportation (Medical): No  ? Lack of Transportation (Non-Medical): No  ?Physical Activity: Sufficiently Active  ? Days of Exercise per Week: 3 days  ? Minutes of Exercise per Session: 150+ min  ?Stress: No Stress Concern Present  ? Feeling of Stress : Not at all  ?Social Connections: Socially Isolated  ? Frequency of Communication  with Friends and Family: More than three times a week  ? Frequency of Social Gatherings with Friends and Family: More than three times a week  ? Attends Religious Services: Never  ? Active Member of Clubs or Organizations: No  ? Attends Archivist Meetings: Never  ? Marital Status: Divorced  ? ? ?Tobacco Counseling ?Ready to quit: No ?Counseling given: Yes ?Tobacco comments: maybe a cigar when golfing ? ? ?Clinical Intake: ? ?Pre-visit preparation completed: No ? ?Diabetic?  No ? ?Interpreter Needed?: No ? ?Activities of Daily Living ? ?  02/17/2022  ?  9:40 AM  ?In your present state of health, do you have any difficulty performing the following activities:  ?Hearing? 0  ?Vision? 0  ?Difficulty concentrating or making decisions? 0  ?Walking or climbing stairs? 0  ?Dressing or bathing? 0  ?Doing errands, shopping? 0  ?Preparing Food and eating ? N  ?Using the Toilet? N  ?In the past six months, have you accidently leaked urine? N  ?Do you have problems with loss of bowel control? N  ?Managing your Medications? N  ?Managing your Finances? N  ?Housekeeping or managing your Housekeeping? N  ? ? ?Patient Care Team: ?Lesleigh Noe, MD as PCP - General (Family Medicine) ? ?Indicate any recent Medical Services you may have received from other than [facility] providers in the past year (date may be approximate). ? ?   ?Assessment:  ? This is a routine wellness examination for [place]. ? ?Hearing/Vision screen ?Hearing Screening - Comments:: No hearing difficulty ?Vision Screening - Comments:: Wears glasses. Followed by Dr Idolina Primer ? ?Dietary issues and exercise activities discussed: ?Exercise  limited by: None identified ? ? Goals Addressed   ? ?  ?  ?  ?  ?  ? This Visit's Progress  ?   Increase physical activity (pt-stated)     ?   Continue to play Golf. ?  ? ?  ? ?Depression Screen ? ?  4

## 2022-02-20 ENCOUNTER — Ambulatory Visit (INDEPENDENT_AMBULATORY_CARE_PROVIDER_SITE_OTHER): Payer: Medicare PPO | Admitting: Family Medicine

## 2022-02-20 VITALS — BP 102/60 | HR 92 | Temp 97.9°F | Ht 68.25 in | Wt 182.2 lb

## 2022-02-20 DIAGNOSIS — Z0000 Encounter for general adult medical examination without abnormal findings: Secondary | ICD-10-CM | POA: Diagnosis not present

## 2022-02-20 DIAGNOSIS — R7303 Prediabetes: Secondary | ICD-10-CM

## 2022-02-20 DIAGNOSIS — Z125 Encounter for screening for malignant neoplasm of prostate: Secondary | ICD-10-CM | POA: Diagnosis not present

## 2022-02-20 DIAGNOSIS — N401 Enlarged prostate with lower urinary tract symptoms: Secondary | ICD-10-CM | POA: Diagnosis not present

## 2022-02-20 DIAGNOSIS — E782 Mixed hyperlipidemia: Secondary | ICD-10-CM

## 2022-02-20 DIAGNOSIS — R3911 Hesitancy of micturition: Secondary | ICD-10-CM | POA: Diagnosis not present

## 2022-02-20 DIAGNOSIS — N529 Male erectile dysfunction, unspecified: Secondary | ICD-10-CM | POA: Diagnosis not present

## 2022-02-20 LAB — COMPREHENSIVE METABOLIC PANEL
ALT: 25 U/L (ref 0–53)
AST: 27 U/L (ref 0–37)
Albumin: 4.5 g/dL (ref 3.5–5.2)
Alkaline Phosphatase: 80 U/L (ref 39–117)
BUN: 14 mg/dL (ref 6–23)
CO2: 26 mEq/L (ref 19–32)
Calcium: 9.4 mg/dL (ref 8.4–10.5)
Chloride: 102 mEq/L (ref 96–112)
Creatinine, Ser: 0.86 mg/dL (ref 0.40–1.50)
GFR: 89.28 mL/min (ref >60.00)
Glucose, Bld: 87 mg/dL (ref 70–99)
Potassium: 4.4 mEq/L (ref 3.5–5.1)
Sodium: 138 mEq/L (ref 135–145)
Total Bilirubin: 0.9 mg/dL (ref 0.2–1.2)
Total Protein: 7.6 g/dL (ref 6.0–8.3)

## 2022-02-20 LAB — LIPID PANEL
Cholesterol: 198 mg/dL (ref 0–200)
HDL: 38.6 mg/dL — ABNORMAL LOW (ref >39.00)
LDL Cholesterol: 129 mg/dL — ABNORMAL HIGH (ref 0–99)
NonHDL: 159.57
Total CHOL/HDL Ratio: 5
Triglycerides: 154 mg/dL — ABNORMAL HIGH (ref 0.0–149.0)
VLDL: 30.8 mg/dL (ref 0.0–40.0)

## 2022-02-20 LAB — CBC
HCT: 50.5 % (ref 39.0–52.0)
Hemoglobin: 17.2 g/dL — ABNORMAL HIGH (ref 13.0–17.0)
MCHC: 34.1 g/dL (ref 30.0–36.0)
MCV: 95.4 fl (ref 78.0–100.0)
Platelets: 302 10*3/uL (ref 150.0–400.0)
RBC: 5.29 Mil/uL (ref 4.22–5.81)
RDW: 13.6 % (ref 11.5–15.5)
WBC: 12.6 10*3/uL — ABNORMAL HIGH (ref 4.0–10.5)

## 2022-02-20 LAB — PSA, MEDICARE: PSA: 0.9 ng/ml (ref 0.10–4.00)

## 2022-02-20 LAB — HEMOGLOBIN A1C: Hgb A1c MFr Bld: 6.1 % (ref 4.6–6.5)

## 2022-02-20 MED ORDER — TADALAFIL 2.5 MG PO TABS: 2.5000 mg | ORAL_TABLET | Freq: Every day | ORAL | 1 refills | Status: DC

## 2022-02-20 MED ORDER — TAMSULOSIN HCL 0.4 MG PO CAPS: 0.4000 mg | ORAL_CAPSULE | Freq: Every day | ORAL | 3 refills | Status: DC

## 2022-02-20 NOTE — Patient Instructions (Addendum)
1) Call the dermatologist and schedule follow-up visit ? ?2) If cholesterol high - consider Pravastatin ? ?3) Cialis - if expensive - Check out GoodRx - for low cost drugs ?- if not effective can increase dose to 5 mg ?- if side effects -- call and can refer to Urology ? ?4) Shoulder/clavicle Pain ?- if not improving ?- make follow-up with me or Dr. Lorelei Pont  ?- voltaren gel  ?

## 2022-02-20 NOTE — Progress Notes (Signed)
Annual Exam  ? ?Chief Complaint:  ?Chief Complaint  ?Patient presents with  ? Medicare Wellness  ?  Part 2   ? ? ?History of Present Illness:  ?Bryce Cameron is a 66 y.o. presents today for annual examination.   ? ?#ED ?- tried viagra but had sinus side effects ?- is interested in cialis ? ? ?Nutrition/Lifestyle ?Diet: eating more veggies and supplement ?Exercise: soccer, golf, walking ?He is single partner, contraception - post menopausal status.  ?Any issues with getting or keeping erection? Yes ? ?Social History  ? ?Tobacco Use  ?Smoking Status Some Days  ? Types: Cigars  ? Last attempt to quit: 11/12/2011  ? Years since quitting: 10.2  ?Smokeless Tobacco Never  ?Tobacco Comments  ? maybe a cigar when golfing  ? ?Social History  ? ?Substance and Sexual Activity  ?Alcohol Use Yes  ? Alcohol/week: 0.0 standard drinks  ? Comment: rarely, beer once a month  ? ?Social History  ? ?Substance and Sexual Activity  ?Drug Use Never  ? ? ? ?Safety ?The patient wears seatbelts: yes.     ?The patient feels safe at home and in their relationships: yes. ? ?General Health ?Dentist in the last year: Yes ?Eye doctor: appt coming up ? ?Weight ?Wt Readings from Last 3 Encounters:  ?02/20/22 182 lb 4 oz (82.7 kg)  ?02/17/22 173 lb (78.5 kg)  ?08/02/21 173 lb (78.5 kg)  ? ?Patient has normal BMI  ?BMI Readings from Last 1 Encounters:  ?02/20/22 27.51 kg/m?  ? ? ? ?Chronic disease screening ?Blood pressure monitoring:  ?BP Readings from Last 3 Encounters:  ?02/20/22 102/60  ?08/02/21 101/71  ?07/14/21 100/70  ? ? ?Lipid Monitoring: Indication for screening: age >35, obesity, diabetes, family hx, CV risk factors.  ?Lipid screening: Yes ? ?Lab Results  ?Component Value Date  ? CHOL 198 02/20/2022  ? HDL 38.60 (L) 02/20/2022  ? LDLCALC 129 (H) 02/20/2022  ? LDLDIRECT 173.0 01/13/2021  ? TRIG 154.0 (H) 02/20/2022  ? CHOLHDL 5 02/20/2022  ? ? ? ?Diabetes Screening: age >49, overweight, family hx, PCOS, hx of gestational diabetes, at risk  ethnicity, elevated blood pressure >135/80.  ?Diabetes Screening screening: Yes ? ?Lab Results  ?Component Value Date  ? HGBA1C 6.1 02/20/2022  ? ? ? ?Prostate Cancer Screening: Yes ?Age 19-69 yo Shared Decision Making ?Higher Risk: Older age, African American, Family Hx of Prostate Cancer - Yes ?Benefits: screening may prevent 1.3 deaths from prostate cancer over 13 years per 1000 men screened and prevent 3 metastatic cases per 1000 men screened. Not enough evidence to support more benefit for AA or [facility] ?Harms: False Positive and psychological harms. 15% of me with false positive over a 2 to 4 year period > resulting in biopsy and complications such as pain, hematospermia, infections. Overdiagnosis - increases with age - found that 20-50% of prostate cancer through screening may have never caused any issues. Harms of treatment include - erectile dysfunction, urinary incontinence, and bothersome bowel symptoms.  ? ?After discussion he does want to get a PSA checked today.  ? ?Inadequate evidence for screening <55 ?No mortality benefit for screening >70  ? ?Lab Results  ?Component Value Date  ? PSA 0.90 02/20/2022  ? PSA 1.01 01/13/2021  ? PSA 0.88 06/10/2010  ? ? ? ? ? ?Colon Cancer Screening:  ?Age 55-75 yo - benefits outweigh the risk. Adults 24-85 yo who have never been screened benefit.  ?Benefits: 134000 people in 2016 will be diagnosed and 49,000  will die - early detection helps ?Harms: Complications 2/2 to colonoscopy ?High Risk (Colonoscopy): genetic disorder (Lynch syndrome or familial adenomatous polyposis), personal hx of IBD, previous adenomatous polyp, or previous colorectal cancer, FamHx start 10 years before the age at diagnosis, increased in males and black race ? ?Options:  ?FIT - looks for hemoglobin (blood in the stool) - specific and fairly sensitive - must be done annually ?Cologuard - looks for DNA and blood - more sensitive - therefore can have more false positives, every 3 years ?Colonoscopy  - every 10 years if normal - sedation, bowl prep, must have someone drive you ? ?Shared decision making and the patient had decided to do colonoscopy due 2029. ? ? ?Social History  ? ?Tobacco Use  ?Smoking Status Some Days  ? Types: Cigars  ? Last attempt to quit: 11/12/2011  ? Years since quitting: 10.2  ?Smokeless Tobacco Never  ?Tobacco Comments  ? maybe a cigar when golfing  ? ?1/2 ppd x 30 years ? ?Lung Cancer Screening (Ages 61-44): not applicable ?20 year pack history? No ?Current Tobacco user? Yes ?Quit less than 15 years ago? Yes ?Interested in low dose CT for lung cancer screening? not applicable ? ?Abdominal Aortic Aneurysm:  ?Age 52-75, 1 time screening, men who have ever smoked done ? ? ? ?Past Medical History:  ?Diagnosis Date  ? Barrett's esophagus 03/13/2008  ? short and biopsies 2022 without intestinal metaplasia so resolved, plus original diagnosis made on less than 1 cm of change  ? DJD (degenerative joint disease)   ? GERD (gastroesophageal reflux disease)   ? Gout 2016  ? diagnosed 2016  ? Hx of adenomatous polyp of colon 03/13/2008  ? Hyperlipidemia   ? Wears glasses   ? ? ?Past Surgical History:  ?Procedure Laterality Date  ? COLONOSCOPY  2016  ? TA, CG  ? ELBOW ARTHROSCOPY Left 11/14/2013  ? Procedure: ARTHROSCOPY LEFT ELBOW, SYNOVECTOMY, DEBRIDEMENT, REMOVAL OF LOOSE BODIES;  Surgeon: Ninetta Lights, MD;  Location: [facility];  Service: Orthopedics;  Laterality: Left;  ? KNEE ARTHROPLASTY  2008  ? right  ? KNEE ARTHROSCOPY  2012  ? left  ? NASAL POLYP EXCISION    ? SHOULDER ARTHROSCOPY  2011  ? left  ? SHOULDER ARTHROSCOPY W/ ROTATOR CUFF REPAIR  2008  ? right  ? SHOULDER INJECTION Left 11/14/2013  ? Procedure: SHOULDER INJECTION;  Surgeon: Ninetta Lights, MD;  Location: [facility];  Service: Orthopedics;  Laterality: Left;  ? TONSILLECTOMY    ? UPPER GASTROINTESTINAL ENDOSCOPY    ? VASECTOMY    ? ? ?Prior to Admission medications   ?Medication Sig Start Date  End Date Taking? Authorizing Provider  ?APPLE CIDER VINEGAR PO Take by mouth.   Yes [provider]  ?Ascorbic Acid (VITAMIN C) 1000 MG tablet Take 1,000 mg by mouth daily.   Yes [provider]  ?Calcium Carbonate-Vit D-Min (CALCIUM 1200 PO) Take by mouth.   Yes [provider]  ?cyanocobalamin 100 MCG tablet Take 100 mcg by mouth daily.   Yes [provider]  ?esomeprazole (NEXIUM) 20 MG capsule Take 20 mg by mouth. Every 3 days   Yes [provider]  ?magnesium 30 MG tablet Take 30 mg by mouth 2 (two) times daily.   Yes [provider]  ?Niacin (VITAMIN B-3 PO) Take by mouth.   Yes [provider]  ?simvastatin (ZOCOR) 10 MG tablet Take 1 tablet (10 mg total) by  mouth at bedtime. 12/16/21  Yes Lesleigh Noe, MD  ?tamsulosin (FLOMAX) 0.4 MG CAPS capsule TAKE 1 CAPSULE EVERY DAY 12/09/21  Yes Lesleigh Noe, MD  ?Zinc Sulfate (ZINC 15 PO) Take by mouth.   Yes [provider]  ? ? ?No Known Allergies ? ? ?Social History  ? ?Socioeconomic History  ? Marital status: Divorced  ?  Spouse name: Not on file  ? Number of children: 3  ? Years of education: college  ? Highest education level: Not on file  ?Occupational History  ? Not on file  ?Tobacco Use  ? Smoking status: Some Days  ?  Types: Cigars  ?  Last attempt to quit: 11/12/2011  ?  Years since quitting: 10.2  ? Smokeless tobacco: Never  ? Tobacco comments:  ?  maybe a cigar when golfing  ?Vaping Use  ? Vaping Use: Never used  ?Substance and Sexual Activity  ? Alcohol use: Yes  ?  Alcohol/week: 0.0 standard drinks  ?  Comment: rarely, beer once a month  ? Drug use: Never  ? Sexual activity: Yes  ?  Birth control/protection: None  ?Other Topics Concern  ? Not on file  ?Social History Narrative  ? 01/06/20  ? From: born in [place], but in [place] since 1989  ? Living: with son [name]  ? Work: was working with [employer] - has been unemployed since [employer]  ?   ? Family: has 3 children (son [name], Radiation protection practitioner;  daughter - died, one adopted child  ?   ? Enjoys: play golf and soccer (but has not been playing since Covid)  ?   ? Exercise: golf, would like to go back to soccer, exercising occasionally  ? Diet: eats respon

## 2022-02-20 NOTE — Assessment & Plan Note (Signed)
Trial of cialis. If side effects will refer to urology.  ?

## 2022-02-21 ENCOUNTER — Other Ambulatory Visit (INDEPENDENT_AMBULATORY_CARE_PROVIDER_SITE_OTHER): Payer: Medicare PPO

## 2022-02-21 DIAGNOSIS — D72829 Elevated white blood cell count, unspecified: Secondary | ICD-10-CM | POA: Diagnosis not present

## 2022-02-21 LAB — WHITE CELL DIFFERENTIAL
Band Neutrophils: 0 %
Basophils Relative: 0.5 % (ref 0.0–3.0)
Eosinophils Relative: 2.1 % (ref 0.0–5.0)
Lymphocytes Relative: 26.5 % (ref 12.0–46.0)
Monocytes Relative: 12.2 % — ABNORMAL HIGH (ref 3.0–12.0)
Neutrophils Relative %: 58.7 % (ref 43.0–77.0)

## 2022-03-03 ENCOUNTER — Other Ambulatory Visit: Payer: Self-pay | Admitting: Nurse Practitioner

## 2022-03-03 ENCOUNTER — Ambulatory Visit
Admission: RE | Admit: 2022-03-03 | Discharge: 2022-03-03 | Disposition: A | Discharge status: Home / Self Care | Payer: Medicare PPO | Source: Ambulatory Visit | Attending: Nurse Practitioner | Admitting: Nurse Practitioner

## 2022-03-03 ENCOUNTER — Ambulatory Visit (INDEPENDENT_AMBULATORY_CARE_PROVIDER_SITE_OTHER): Payer: Medicare PPO | Admitting: Nurse Practitioner

## 2022-03-03 VITALS — BP 112/66 | HR 100 | Temp 97.9°F | Resp 16 | Wt 182.1 lb

## 2022-03-03 DIAGNOSIS — R6884 Jaw pain: Secondary | ICD-10-CM

## 2022-03-03 DIAGNOSIS — R221 Localized swelling, mass and lump, neck: Secondary | ICD-10-CM

## 2022-03-03 DIAGNOSIS — R590 Localized enlarged lymph nodes: Secondary | ICD-10-CM | POA: Diagnosis not present

## 2022-03-03 NOTE — Progress Notes (Signed)
? ?Acute Office Visit ? ?Subjective:  ? ?  ?Patient ID: Sabian Kuba, male    DOB: 03-Oct-1956, 66 y.o.   MRN: [id number] ? ?Chief Complaint  ?Patient presents with  ? neck swelling  ?  Both sides, x 10 days, left side of neck/glands hurt a lot more now to the point he is not able to sleep well. No sore throat or fever.  ? ? ?HPI ?Patient is in today for  ?Swelling behind the jaw. Describe a constant pain That is described as dull in nature during the day.  Described as sharp and making it difficult to sleep at night.  States during the daytime movement of his jaw does not make any difference.  Movement of head and neck does not make a difference.  He does have a mass to the left lateral inferior neck that is described as "fat".  Patient states he has never been truly evaluated with imaging just by palpation.  No history of sialadenitis ?Has been taking ibuprofen 600 mg at a time with some relief.  States that it is waxing and waning in nature.  States last week Thursday Friday hurt and then abated over the weekend and has come back. ? ? ?Review of Systems  ?Constitutional:  Positive for malaise/fatigue. Negative for chills and fever.  ?HENT:  Negative for ear discharge, ear pain and sore throat.   ?     Jaw pain: positive. ?Negative: Difficulty swallowing, difficulty breathing, difficulty chewing, difficulty eating.  ? ? ?   ?Objective:  ?  ?BP 112/66   Pulse 100   Temp 97.9 ?F (36.6 ?C)   Resp 16   Wt 182 lb 2 oz (82.6 kg)   SpO2 94%   BMI 27.49 kg/m?  ? ? ?Physical Exam ?Vitals and nursing note reviewed.  ?Constitutional:   ?   Appearance: Normal appearance.  ?HENT:  ?   Head:  ? ?   Right Ear: Tympanic membrane, ear canal and external ear normal.  ?   Left Ear: Tympanic membrane, ear canal and external ear normal.  ?Neck:  ?   Thyroid: No thyroid mass, thyromegaly or thyroid tenderness.  ? ?   Comments: No pain with jaw movement per patient report.  ?Does have some clicking and popping  ?Cardiovascular:  ?    Rate and Rhythm: Normal rate and regular rhythm.  ?   Heart sounds: Normal heart sounds.  ?Pulmonary:  ?   Effort: Pulmonary effort is normal.  ?   Breath sounds: Normal breath sounds.  ?Musculoskeletal:  ?   Cervical back: Normal range of motion. Tenderness present. No pain with movement.  ?Lymphadenopathy:  ?   Cervical: No cervical adenopathy.  ?Skin: ?   General: Skin is warm.  ?Neurological:  ?   Mental Status: He is alert.  ? ? ?No results found for any visits on 03/03/22. ? ? ?   ?Assessment & Plan:  ? ?Problem List Items Addressed This Visit   ? ?  ? Other  ? Mass in neck - Primary  ?  Mass in neck that has not been evaluated per patient report in regards to imaging.  We will order ultrasound of soft tissue neck stat.  Pending result no red alarm symptoms in office. ? ?  ?  ? Relevant Orders  ? US Soft Tissue Head/Neck (NON-THYROID)  ? Jaw pain  ?  Experiencing pain behind TMJ area below left ear.  Continue taking ibuprofen 6 or milligrams 3  times daily as needed pain recent blood work showed a modest white blood cell count elevation at 12.6.  Pending ultrasound results ? ?  ?  ? ? ?No orders of the defined types were placed in this encounter. ? ? ?Return if symptoms worsen or fail to improve. ? ?Romilda Garret, NP ? ? ?

## 2022-03-03 NOTE — Assessment & Plan Note (Signed)
Mass in neck that has not been evaluated per patient report in regards to imaging.  We will order ultrasound of soft tissue neck stat.  Pending result no red alarm symptoms in office. ?

## 2022-03-03 NOTE — Patient Instructions (Addendum)
Nice to see you today ?I would continue taking ibuprofen '600mg'$  every 8 hours as needed for pain  ?I will be in touch with the ultrasound once I have the results ?I did add some information on TMJ as your jaw was popping some on exam  ?

## 2022-03-03 NOTE — Assessment & Plan Note (Signed)
Experiencing pain behind TMJ area below left ear.  Continue taking ibuprofen 6 or milligrams 3 times daily as needed pain recent blood work showed a modest white blood cell count elevation at 12.6.  Pending ultrasound results ?

## 2022-03-08 ENCOUNTER — Ambulatory Visit
Admission: RE | Admit: 2022-03-08 | Discharge: 2022-03-08 | Disposition: A | Discharge status: Home / Self Care | Payer: Medicare PPO | Source: Ambulatory Visit | Attending: Nurse Practitioner | Admitting: Nurse Practitioner

## 2022-03-08 ENCOUNTER — Telehealth: Payer: Self-pay

## 2022-03-08 ENCOUNTER — Other Ambulatory Visit: Payer: Self-pay | Admitting: Nurse Practitioner

## 2022-03-08 DIAGNOSIS — R221 Localized swelling, mass and lump, neck: Secondary | ICD-10-CM | POA: Insufficient documentation

## 2022-03-08 DIAGNOSIS — R6884 Jaw pain: Secondary | ICD-10-CM | POA: Insufficient documentation

## 2022-03-08 MED ORDER — IOHEXOL 300 MG/ML  SOLN
100.0000 mL | Freq: Once | INTRAMUSCULAR | Status: AC | PRN
  Administered 2022-03-08: 100 mL via INTRAVENOUS

## 2022-03-08 NOTE — Telephone Encounter (Signed)
Noted.  Will review full to CT scan prior to contacting patient. ?

## 2022-03-08 NOTE — Telephone Encounter (Signed)
Took a Stat CT report from [facility] at [facility]. Gave verbal report to Romilda Garret, NP, the provider who ordered the CT. Results are in [place].  ?

## 2022-03-10 ENCOUNTER — Other Ambulatory Visit: Payer: Self-pay | Admitting: Family Medicine

## 2022-03-10 DIAGNOSIS — E782 Mixed hyperlipidemia: Secondary | ICD-10-CM

## 2022-03-28 ENCOUNTER — Encounter: Payer: Self-pay | Admitting: *Deleted

## 2022-04-05 DIAGNOSIS — D37030 Neoplasm of uncertain behavior of the parotid salivary glands: Secondary | ICD-10-CM | POA: Diagnosis not present

## 2022-04-10 ENCOUNTER — Other Ambulatory Visit: Payer: Self-pay | Admitting: Otolaryngology

## 2022-04-10 DIAGNOSIS — R221 Localized swelling, mass and lump, neck: Secondary | ICD-10-CM

## 2022-04-14 ENCOUNTER — Ambulatory Visit
Admission: RE | Admit: 2022-04-14 | Discharge: 2022-04-14 | Disposition: A | Discharge status: Home / Self Care | Payer: Medicare PPO | Source: Ambulatory Visit | Attending: Otolaryngology | Admitting: Otolaryngology

## 2022-04-14 DIAGNOSIS — R590 Localized enlarged lymph nodes: Secondary | ICD-10-CM | POA: Diagnosis not present

## 2022-04-14 DIAGNOSIS — D117 Benign neoplasm of other major salivary glands: Secondary | ICD-10-CM | POA: Diagnosis not present

## 2022-04-14 DIAGNOSIS — Z85828 Personal history of other malignant neoplasm of skin: Secondary | ICD-10-CM | POA: Diagnosis not present

## 2022-04-14 DIAGNOSIS — Z1283 Encounter for screening for malignant neoplasm of skin: Secondary | ICD-10-CM | POA: Diagnosis not present

## 2022-04-14 DIAGNOSIS — D360 Benign neoplasm of lymph nodes: Secondary | ICD-10-CM | POA: Insufficient documentation

## 2022-04-14 DIAGNOSIS — R221 Localized swelling, mass and lump, neck: Secondary | ICD-10-CM | POA: Diagnosis not present

## 2022-04-14 DIAGNOSIS — D2261 Melanocytic nevi of right upper limb, including shoulder: Secondary | ICD-10-CM | POA: Diagnosis not present

## 2022-04-14 DIAGNOSIS — D485 Neoplasm of uncertain behavior of skin: Secondary | ICD-10-CM | POA: Diagnosis not present

## 2022-04-14 DIAGNOSIS — Z08 Encounter for follow-up examination after completed treatment for malignant neoplasm: Secondary | ICD-10-CM | POA: Diagnosis not present

## 2022-04-14 DIAGNOSIS — D119 Benign neoplasm of major salivary gland, unspecified: Secondary | ICD-10-CM | POA: Diagnosis not present

## 2022-04-14 DIAGNOSIS — D225 Melanocytic nevi of trunk: Secondary | ICD-10-CM | POA: Diagnosis not present

## 2022-04-14 NOTE — Procedures (Signed)
Interventional Radiology Procedure Note  Procedure: US guided biopsy of left cervical lymph nodes.  Complications: None EBL: None Recommendations: - DC when goals met - Routine wound care - Follow up pathology    Signed,  Corrie Mckusick, DO

## 2022-04-17 LAB — SURGICAL PATHOLOGY

## 2022-04-19 DIAGNOSIS — C329 Malignant neoplasm of larynx, unspecified: Secondary | ICD-10-CM | POA: Diagnosis not present

## 2022-04-19 DIAGNOSIS — R590 Localized enlarged lymph nodes: Secondary | ICD-10-CM | POA: Diagnosis not present

## 2022-04-25 DIAGNOSIS — D119 Benign neoplasm of major salivary gland, unspecified: Secondary | ICD-10-CM | POA: Diagnosis not present

## 2022-04-25 DIAGNOSIS — K118 Other diseases of salivary glands: Secondary | ICD-10-CM | POA: Diagnosis not present

## 2022-05-12 DIAGNOSIS — D225 Melanocytic nevi of trunk: Secondary | ICD-10-CM | POA: Diagnosis not present

## 2022-06-20 NOTE — Telephone Encounter (Signed)
Patient has been seen by  [facility], Christopher Blake, MD   [place], [place] [address]   Phone: [phone]   Fax: [phone]     [facility]   [address] [place], [place] [address]  Nothing further needed.

## 2022-09-06 ENCOUNTER — Telehealth: Payer: Self-pay
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
## 2022-09-07 ENCOUNTER — Ambulatory Visit
Admission: EM | Admit: 2022-09-07 | Discharge: 2022-09-07 | Disposition: A | Discharge status: Home / Self Care | Payer: Medicare PPO | Attending: Emergency Medicine | Admitting: Emergency Medicine

## 2022-09-07 DIAGNOSIS — R300 Dysuria: Secondary | ICD-10-CM | POA: Diagnosis present

## 2022-09-07 DIAGNOSIS — R39198 Other difficulties with micturition: Secondary | ICD-10-CM | POA: Diagnosis not present

## 2022-09-07 LAB — POCT URINALYSIS DIP (MANUAL ENTRY)
Bilirubin, UA: NEGATIVE
Glucose, UA: NEGATIVE mg/dL
Ketones, POC UA: NEGATIVE mg/dL
Nitrite, UA: NEGATIVE
Protein Ur, POC: 100 mg/dL — AB
Spec Grav, UA: 1.02 (ref 1.010–1.025)
Urobilinogen, UA: 1 E.U./dL
pH, UA: 5.5 (ref 5.0–8.0)

## 2022-09-07 MED ORDER — SULFAMETHOXAZOLE-TRIMETHOPRIM 800-160 MG PO TABS: 1.0000 | ORAL_TABLET | Freq: Two times a day (BID) | ORAL | 0 refills | Status: AC

## 2022-09-07 NOTE — ED Triage Notes (Signed)
Patient to Urgent Care with complaints of dysuria and urinary frequency that started on 11/5. Reports voiding small amounts, foul smelling urine. Reports feeling like he has to strain in order to urinate. Does report poor fluid intake.  Denies any abdominal pain or back pain. Fever on 11/6.   Patient takes tamsulosin for BPH.

## 2022-09-07 NOTE — Telephone Encounter (Signed)
Pt notified as instructed by Dutch Quint FNP; pt has already come back home; pt said the pain upon urination has worsened and pt has to strain a lot to get urine out. Pt does not want to go to ED but is in the car now going to [facility] on [address] in [place].sending note to Dutch Quint FNP as Juluis Rainier.

## 2022-09-07 NOTE — ED Provider Notes (Signed)
Bryce Cameron    CSN: [id number] Arrival date & time: 09/07/22  0853      History   Chief Complaint Chief Complaint  Patient presents with   Urinary Frequency    HPI Bryce Cameron is a 66 y.o. male.  Patient presents with 4-day history of dysuria, malodorous urine, and urinary frequency.  He reports decreased urine output but has not been drinking much water.  He denies fever, abdominal pain, hematuria, flank pain, penile discharge, testicular pain, or other symptoms.  His medical history includes BPH.   The history is provided by the patient and medical records.    Past Medical History:  Diagnosis Date   Barrett's esophagus 03/13/2008   short and biopsies 2022 without intestinal metaplasia so resolved, plus original diagnosis made on less than 1 cm of change   DJD (degenerative joint disease)    GERD (gastroesophageal reflux disease)    Gout 2016   diagnosed 2016   Hx of adenomatous polyp of colon 03/13/2008   Hyperlipidemia    Wears glasses     Patient Active Problem List   Diagnosis Date Noted   Mass in neck 03/03/2022   Jaw pain 03/03/2022   Epidermoid cyst of ear 07/14/2021   Prediabetes 02/21/2021   Benign prostatic hyperplasia with urinary hesitancy 01/13/2021   Peripheral neuropathy 01/13/2021   Common wart 01/06/2020   Erectile dysfunction 01/06/2020   Lower urinary tract symptoms 01/06/2020   Gout 09/06/2014   Mixed hyperlipidemia 06/10/2010   GERD 06/10/2010   Hx of adenomatous polyp of colon 03/13/2008    Past Surgical History:  Procedure Laterality Date   COLONOSCOPY  2016   TA, CG   ELBOW ARTHROSCOPY Left 11/14/2013   Procedure: ARTHROSCOPY LEFT ELBOW, SYNOVECTOMY, DEBRIDEMENT, REMOVAL OF LOOSE BODIES;  Surgeon: Ninetta Lights, MD;  Location: [place];  Service: Orthopedics;  Laterality: Left;   KNEE ARTHROPLASTY  2008   right   KNEE ARTHROSCOPY  2012   left   NASAL POLYP EXCISION     SHOULDER ARTHROSCOPY  2011    left   SHOULDER ARTHROSCOPY W/ ROTATOR CUFF REPAIR  2008   right   SHOULDER INJECTION Left 11/14/2013   Procedure: SHOULDER INJECTION;  Surgeon: Ninetta Lights, MD;  Location: [place];  Service: Orthopedics;  Laterality: Left;   TONSILLECTOMY     UPPER GASTROINTESTINAL ENDOSCOPY     VASECTOMY         Home Medications    Prior to Admission medications   Medication Sig Start Date End Date Taking? Authorizing Provider  sulfamethoxazole-trimethoprim (BACTRIM DS) 800-160 MG tablet Take 1 tablet by mouth 2 (two) times daily for 7 days. 09/07/22 09/14/22 Yes Sharion Balloon, NP  APPLE CIDER VINEGAR PO Take by mouth.    [provider]  Ascorbic Acid (VITAMIN C) 1000 MG tablet Take 1,000 mg by mouth daily.    [provider]  Calcium Carbonate-Vit D-Min (CALCIUM 1200 PO) Take by mouth.    [provider]  cyanocobalamin 100 MCG tablet Take 100 mcg by mouth daily.    [provider]  esomeprazole (NEXIUM) 20 MG capsule Take 20 mg by mouth. Every 3 days    [provider]  magnesium 30 MG tablet Take 30 mg by mouth 2 (two) times daily.    [provider]  Niacin (VITAMIN B-3 PO) Take by mouth.    [provider]  simvastatin (ZOCOR) 10 MG tablet Take 1 tablet (  10 mg total) by mouth at bedtime. 12/16/21   Waunita Schooner, MD  tadalafil 2.5 MG TABS Take 1 tablet (2.5 mg total) by mouth daily. 02/20/22   Waunita Schooner, MD  tamsulosin (FLOMAX) 0.4 MG CAPS capsule Take 1 capsule (0.4 mg total) by mouth daily. 02/20/22   Waunita Schooner, MD  Zinc Sulfate (ZINC 15 PO) Take by mouth.    [provider]    Family History Family History  Problem Relation Age of Onset   Colon polyps Mother    Colon cancer Mother 67   Lung cancer Father    Heart disease Brother 63       had bypass surgery   Esophageal cancer Neg Hx    Rectal cancer Neg Hx    Stomach cancer Neg Hx     Social History Social History   Tobacco Use    Smoking status: Some Days    Types: Cigars    Last attempt to quit: 11/12/2011    Years since quitting: 10.8   Smokeless tobacco: Never   Tobacco comments:    maybe a cigar when golfing  Vaping Use   Vaping Use: Never used  Substance Use Topics   Alcohol use: Yes    Alcohol/week: 0.0 standard drinks of alcohol    Comment: rarely, beer once a month   Drug use: Never     Allergies   Patient has no known allergies.   Review of Systems Review of Systems  Constitutional:  Negative for chills and fever.  Gastrointestinal:  Negative for abdominal pain.  Genitourinary:  Positive for difficulty urinating, dysuria and frequency. Negative for flank pain, hematuria, penile discharge and testicular pain.  All other systems reviewed and are negative.    Physical Exam Triage Vital Signs ED Triage Vitals  Enc Vitals Group     BP 09/07/22 0922 110/78     Pulse Rate 09/07/22 0917 (!) 103     Resp 09/07/22 0917 18     Temp 09/07/22 0917 98 F (36.7 C)     Temp src --      SpO2 09/07/22 0917 94 %     Weight 09/07/22 0921 176 lb (79.8 kg)     Height 09/07/22 0921 '5\' 9"'$  (1.753 m)     Head Circumference --      Peak Flow --      Pain Score 09/07/22 0921 0     Pain Loc --      Pain Edu? --      Excl. in [place]? --    No data found.  Updated Vital Signs BP 110/78   Pulse (!) 103   Temp 98 F (36.7 C)   Resp 18   Ht '5\' 9"'$  (1.753 m)   Wt 176 lb (79.8 kg)   SpO2 94%   BMI 25.99 kg/m   Visual Acuity Right Eye Distance:   Left Eye Distance:   Bilateral Distance:    Right Eye Near:   Left Eye Near:    Bilateral Near:     Physical Exam Vitals and nursing note reviewed.  Constitutional:      General: He is not in acute distress.    Appearance: Normal appearance. He is well-developed. He is not ill-appearing.  HENT:     Mouth/Throat:     Mouth: Mucous membranes are moist.  Cardiovascular:     Rate and Rhythm: Normal rate and regular rhythm.     Heart sounds: Normal  heart sounds.  Pulmonary:  Effort: Pulmonary effort is normal. No respiratory distress.     Breath sounds: Normal breath sounds.  Abdominal:     General: Bowel sounds are normal.     Palpations: Abdomen is soft.     Tenderness: There is no abdominal tenderness. There is no left CVA tenderness, guarding or rebound.  Musculoskeletal:     Cervical back: Neck supple.  Skin:    General: Skin is warm and dry.  Neurological:     Mental Status: He is alert.  Psychiatric:        Mood and Affect: Mood normal.        Behavior: Behavior normal.      UC Treatments / Results  Labs (all labs ordered are listed, but only abnormal results are displayed) Labs Reviewed  POCT URINALYSIS DIP (MANUAL ENTRY) - Abnormal; Notable for the following components:      Result Value   Clarity, UA cloudy (*)    Blood, UA large (*)    Protein Ur, POC =100 (*)    Leukocytes, UA Large (3+) (*)    All other components within normal limits  URINE CULTURE    EKG   Radiology No results found.  Procedures Procedures (including critical care time)  Medications Ordered in UC Medications - No data to display  Initial Impression / Assessment and Plan / UC Course  I have reviewed the triage vital signs and the nursing notes.  Pertinent labs & imaging results that were available during my care of the patient were reviewed by me and considered in my medical decision making (see chart for details).    Dysuria, decreased urine stream.  Treating with Bactrim. Urine culture pending. Discussed with patient that we will call him if the urine culture shows the need to change or discontinue the antibiotic. Instructed him to follow-up with him PCP.  ED precautions for decreased urine stream discussed. Patient agrees to plan of care.      Final Clinical Impressions(s) / UC Diagnoses   Final diagnoses:  Dysuria  Decreased urine stream     Discharge Instructions      Take the antibiotic as directed.  The  urine culture is pending.  We will call you if it shows the need to change or discontinue your antibiotic.    Follow up with your primary care provider.        ED Prescriptions     Medication Sig Dispense Auth. Provider   sulfamethoxazole-trimethoprim (BACTRIM DS) 800-160 MG tablet Take 1 tablet by mouth 2 (two) times daily for 7 days. 14 tablet Sharion Balloon, NP      PDMP not reviewed this encounter.   Sharion Balloon, NP 09/07/22 [phone]

## 2022-09-07 NOTE — Discharge Instructions (Addendum)
Take the antibiotic as directed.  The urine culture is pending.  We will call you if it shows the need to change or discontinue your antibiotic.    Follow up with your primary care provider.    

## 2022-09-09 LAB — URINE CULTURE: Culture: >=100000 — AB

## 2022-12-13 ENCOUNTER — Ambulatory Visit
Admission: EM | Admit: 2022-12-13 | Discharge: 2022-12-13 | Disposition: A | Discharge status: Home / Self Care | Payer: Medicare PPO | Attending: Emergency Medicine | Admitting: Emergency Medicine

## 2022-12-13 DIAGNOSIS — R300 Dysuria: Secondary | ICD-10-CM | POA: Diagnosis present

## 2022-12-13 LAB — POCT URINALYSIS DIP (MANUAL ENTRY)
Bilirubin, UA: NEGATIVE
Glucose, UA: NEGATIVE mg/dL
Ketones, POC UA: NEGATIVE mg/dL
Nitrite, UA: NEGATIVE
Protein Ur, POC: NEGATIVE mg/dL
Spec Grav, UA: <=1.005 — AB (ref 1.010–1.025)
Urobilinogen, UA: 0.2 E.U./dL
pH, UA: 6 (ref 5.0–8.0)

## 2022-12-13 MED ORDER — SULFAMETHOXAZOLE-TRIMETHOPRIM 800-160 MG PO TABS: 1.0000 | ORAL_TABLET | Freq: Two times a day (BID) | ORAL | 0 refills | Status: AC

## 2022-12-13 NOTE — ED Provider Notes (Signed)
Bryce Cameron    CSN: [id number] Arrival date & time: 12/13/22  1119      History   Chief Complaint Chief Complaint  Patient presents with   Dysuria    HPI Bryce Cameron is a 67 y.o. male.  Patient presents with 3 week history of dysuria, malodorous urine, urinary frequency, decreased urine stream.  He denies fever, abdominal pain, hematuria, flank pain, penile discharge, testicular pain, or other symptoms. Treating symptoms with cranberry.  His medical history includes BPH.   The history is provided by the patient and medical records.    Past Medical History:  Diagnosis Date   Barrett's esophagus 03/13/2008   short and biopsies 2022 without intestinal metaplasia so resolved, plus original diagnosis made on less than 1 cm of change   DJD (degenerative joint disease)    GERD (gastroesophageal reflux disease)    Gout 2016   diagnosed 2016   Hx of adenomatous polyp of colon 03/13/2008   Hyperlipidemia    Wears glasses     Patient Active Problem List   Diagnosis Date Noted   Mass in neck 03/03/2022   Jaw pain 03/03/2022   Epidermoid cyst of ear 07/14/2021   Prediabetes 02/21/2021   Benign prostatic hyperplasia with urinary hesitancy 01/13/2021   Peripheral neuropathy 01/13/2021   Common wart 01/06/2020   Erectile dysfunction 01/06/2020   Lower urinary tract symptoms 01/06/2020   Gout 09/06/2014   Mixed hyperlipidemia 06/10/2010   GERD 06/10/2010   Hx of adenomatous polyp of colon 03/13/2008    Past Surgical History:  Procedure Laterality Date   COLONOSCOPY  2016   TA, CG   ELBOW ARTHROSCOPY Left 11/14/2013   Procedure: ARTHROSCOPY LEFT ELBOW, SYNOVECTOMY, DEBRIDEMENT, REMOVAL OF LOOSE BODIES;  Surgeon: Ninetta Lights, MD;  Location: [place];  Service: Orthopedics;  Laterality: Left;   KNEE ARTHROPLASTY  2008   right   KNEE ARTHROSCOPY  2012   left   NASAL POLYP EXCISION     SHOULDER ARTHROSCOPY  2011   left   SHOULDER ARTHROSCOPY  W/ ROTATOR CUFF REPAIR  2008   right   SHOULDER INJECTION Left 11/14/2013   Procedure: SHOULDER INJECTION;  Surgeon: Ninetta Lights, MD;  Location: [place];  Service: Orthopedics;  Laterality: Left;   TONSILLECTOMY     UPPER GASTROINTESTINAL ENDOSCOPY     VASECTOMY         Home Medications    Prior to Admission medications   Medication Sig Start Date End Date Taking? Authorizing Provider  sulfamethoxazole-trimethoprim (BACTRIM DS) 800-160 MG tablet Take 1 tablet by mouth 2 (two) times daily for 7 days. 12/13/22 12/20/22 Yes Sharion Balloon, NP  APPLE CIDER VINEGAR PO Take by mouth.    [provider]  Ascorbic Acid (VITAMIN C) 1000 MG tablet Take 1,000 mg by mouth daily.    [provider]  Calcium Carbonate-Vit D-Min (CALCIUM 1200 PO) Take by mouth.    [provider]  cyanocobalamin 100 MCG tablet Take 100 mcg by mouth daily.    [provider]  esomeprazole (NEXIUM) 20 MG capsule Take 20 mg by mouth. Every 3 days    [provider]  magnesium 30 MG tablet Take 30 mg by mouth 2 (two) times daily.    [provider]  Niacin (VITAMIN B-3 PO) Take by mouth.    [provider]  simvastatin (ZOCOR) 10 MG tablet Take 1 tablet (10 mg total) by mouth at bedtime.  12/16/21   Waunita Schooner, MD  tadalafil 2.5 MG TABS Take 1 tablet (2.5 mg total) by mouth daily. 02/20/22   Waunita Schooner, MD  tamsulosin (FLOMAX) 0.4 MG CAPS capsule Take 1 capsule (0.4 mg total) by mouth daily. 02/20/22   Waunita Schooner, MD  Zinc Sulfate (ZINC 15 PO) Take by mouth.    [provider]    Family History Family History  Problem Relation Age of Onset   Colon polyps Mother    Colon cancer Mother 10   Lung cancer Father    Heart disease Brother 51       had bypass surgery   Esophageal cancer Neg Hx    Rectal cancer Neg Hx    Stomach cancer Neg Hx     Social History Social History   Tobacco Use   Smoking status: Some Days     Types: Cigars    Last attempt to quit: 11/12/2011    Years since quitting: 11.0   Smokeless tobacco: Never   Tobacco comments:    maybe a cigar when golfing  Vaping Use   Vaping Use: Never used  Substance Use Topics   Alcohol use: Yes    Alcohol/week: 0.0 standard drinks of alcohol    Comment: rarely, beer once a month   Drug use: Never     Allergies   Patient has no known allergies.   Review of Systems Review of Systems  Constitutional:  Negative for chills and fever.  Gastrointestinal:  Negative for abdominal pain, nausea and vomiting.  Genitourinary:  Positive for difficulty urinating, dysuria and frequency. Negative for flank pain, hematuria, penile discharge and testicular pain.  All other systems reviewed and are negative.    Physical Exam Triage Vital Signs ED Triage Vitals  Enc Vitals Group     BP      Pulse      Resp      Temp      Temp src      SpO2      Weight      Height      Head Circumference      Peak Flow      Pain Score      Pain Loc      Pain Edu?      Excl. in [place]?    No data found.  Updated Vital Signs BP 122/79   Pulse 90   Temp 98.1 F (36.7 C)   Resp 18   SpO2 94%   Visual Acuity Right Eye Distance:   Left Eye Distance:   Bilateral Distance:    Right Eye Near:   Left Eye Near:    Bilateral Near:     Physical Exam Vitals and nursing note reviewed.  Constitutional:      General: He is not in acute distress.    Appearance: Normal appearance. He is well-developed. He is not ill-appearing.  HENT:     Mouth/Throat:     Mouth: Mucous membranes are moist.  Cardiovascular:     Rate and Rhythm: Normal rate and regular rhythm.     Heart sounds: Normal heart sounds.  Pulmonary:     Effort: Pulmonary effort is normal. No respiratory distress.     Breath sounds: Normal breath sounds.  Abdominal:     General: Bowel sounds are normal.     Palpations: Abdomen is soft.     Tenderness: There is no abdominal tenderness. There is no  right CVA tenderness, left CVA tenderness,  guarding or rebound.  Musculoskeletal:     Cervical back: Neck supple.  Skin:    General: Skin is warm and dry.  Neurological:     Mental Status: He is alert.  Psychiatric:        Mood and Affect: Mood normal.        Behavior: Behavior normal.      UC Treatments / Results  Labs (all labs ordered are listed, but only abnormal results are displayed) Labs Reviewed  POCT URINALYSIS DIP (MANUAL ENTRY) - Abnormal; Notable for the following components:      Result Value   Clarity, UA cloudy (*)    Spec Grav, UA <=1.005 (*)    Blood, UA trace-intact (*)    Leukocytes, UA Large (3+) (*)    All other components within normal limits  URINE CULTURE    EKG   Radiology No results found.  Procedures Procedures (including critical care time)  Medications Ordered in UC Medications - No data to display  Initial Impression / Assessment and Plan / UC Course  I have reviewed the triage vital signs and the nursing notes.  Pertinent labs & imaging results that were available during my care of the patient were reviewed by me and considered in my medical decision making (see chart for details).    Dysuria.  Treating with Bactrim. Urine culture pending. Discussed with patient that we will call him if the urine culture shows the need to change or discontinue the antibiotic. Instructed him to follow-up with his PCP or a urologist.  ED precautions discussed.  Patient agrees to plan of care.     Final Clinical Impressions(s) / UC Diagnoses   Final diagnoses:  Dysuria     Discharge Instructions      Take the antibiotic as directed.  The urine culture is pending.  We will call you if it shows the need to change or discontinue your antibiotic.    Follow up with your primary care provider or a urologist.        ED Prescriptions     Medication Sig Dispense Auth. Provider   sulfamethoxazole-trimethoprim (BACTRIM DS) 800-160 MG tablet Take 1  tablet by mouth 2 (two) times daily for 7 days. 14 tablet Sharion Balloon, NP      PDMP not reviewed this encounter.   Sharion Balloon, NP 12/13/22 1258

## 2022-12-13 NOTE — ED Triage Notes (Addendum)
Patient to Urgent Care with complaints of dysuria/ malodorous urine. Reports that he has a decreased urine stream plus urinary frequency. Symptoms started three weeks ago. Denies any known fevers.  Currently taking tamulosin for BPH. Also drinking cranberry juice.

## 2022-12-13 NOTE — Discharge Instructions (Signed)
Take the antibiotic as directed.  The urine culture is pending.  We will call you if it shows the need to change or discontinue your antibiotic.    Follow up with your primary care provider or a urologist.

## 2022-12-15 LAB — URINE CULTURE: Culture: >=100000 — AB

## 2023-01-10 ENCOUNTER — Ambulatory Visit (INDEPENDENT_AMBULATORY_CARE_PROVIDER_SITE_OTHER): Payer: Medicare PPO | Admitting: Emergency Medicine

## 2023-01-10 ENCOUNTER — Encounter: Payer: Self-pay | Admitting: Emergency Medicine

## 2023-01-10 ENCOUNTER — Other Ambulatory Visit: Payer: Self-pay | Admitting: Emergency Medicine

## 2023-01-10 ENCOUNTER — Telehealth: Payer: Self-pay

## 2023-01-10 VITALS — BP 110/74 | HR 90 | Temp 98.1°F | Ht 69.0 in | Wt 177.4 lb

## 2023-01-10 DIAGNOSIS — N390 Urinary tract infection, site not specified: Secondary | ICD-10-CM | POA: Insufficient documentation

## 2023-01-10 DIAGNOSIS — E785 Hyperlipidemia, unspecified: Secondary | ICD-10-CM | POA: Diagnosis not present

## 2023-01-10 DIAGNOSIS — N401 Enlarged prostate with lower urinary tract symptoms: Secondary | ICD-10-CM | POA: Diagnosis not present

## 2023-01-10 DIAGNOSIS — F17200 Nicotine dependence, unspecified, uncomplicated: Secondary | ICD-10-CM

## 2023-01-10 DIAGNOSIS — R7303 Prediabetes: Secondary | ICD-10-CM | POA: Diagnosis not present

## 2023-01-10 DIAGNOSIS — Z7689 Persons encountering health services in other specified circumstances: Secondary | ICD-10-CM

## 2023-01-10 DIAGNOSIS — R399 Unspecified symptoms and signs involving the genitourinary system: Secondary | ICD-10-CM

## 2023-01-10 DIAGNOSIS — Z87440 Personal history of urinary (tract) infections: Secondary | ICD-10-CM | POA: Diagnosis not present

## 2023-01-10 DIAGNOSIS — E782 Mixed hyperlipidemia: Secondary | ICD-10-CM

## 2023-01-10 DIAGNOSIS — R3911 Hesitancy of micturition: Secondary | ICD-10-CM

## 2023-01-10 LAB — POCT URINALYSIS DIPSTICK
Bilirubin, UA: NEGATIVE
Blood, UA: NEGATIVE
Glucose, UA: NEGATIVE
Ketones, UA: NEGATIVE
Nitrite, UA: NEGATIVE
Protein, UA: NEGATIVE
Spec Grav, UA: <=1.005 — AB (ref 1.010–1.025)
Urobilinogen, UA: 0.2 E.U./dL
pH, UA: 6 (ref 5.0–8.0)

## 2023-01-10 LAB — CBC WITH DIFFERENTIAL/PLATELET
Basophils Absolute: 0.1 10*3/uL (ref 0.0–0.1)
Basophils Relative: 0.8 % (ref 0.0–3.0)
Eosinophils Absolute: 0.1 10*3/uL (ref 0.0–0.7)
Eosinophils Relative: 1.1 % (ref 0.0–5.0)
HCT: 52.3 % — ABNORMAL HIGH (ref 39.0–52.0)
Hemoglobin: 18 g/dL (ref 13.0–17.0)
Lymphocytes Relative: 26.6 % (ref 12.0–46.0)
Lymphs Abs: 3.4 10*3/uL (ref 0.7–4.0)
MCHC: 34.5 g/dL (ref 30.0–36.0)
MCV: 94.1 fl (ref 78.0–100.0)
Monocytes Absolute: 1.3 10*3/uL — ABNORMAL HIGH (ref 0.1–1.0)
Monocytes Relative: 9.7 % (ref 3.0–12.0)
Neutro Abs: 8 10*3/uL — ABNORMAL HIGH (ref 1.4–7.7)
Neutrophils Relative %: 61.8 % (ref 43.0–77.0)
Platelets: 347 10*3/uL (ref 150.0–400.0)
RBC: 5.55 Mil/uL (ref 4.22–5.81)
RDW: 13.4 % (ref 11.5–15.5)
WBC: 12.9 10*3/uL — ABNORMAL HIGH (ref 4.0–10.5)

## 2023-01-10 LAB — LIPID PANEL
Cholesterol: 224 mg/dL — ABNORMAL HIGH (ref 0–200)
HDL: 34.3 mg/dL — ABNORMAL LOW (ref >39.00)
NonHDL: 189.41
Total CHOL/HDL Ratio: 7
Triglycerides: 215 mg/dL — ABNORMAL HIGH (ref 0.0–149.0)
VLDL: 43 mg/dL — ABNORMAL HIGH (ref 0.0–40.0)

## 2023-01-10 LAB — COMPREHENSIVE METABOLIC PANEL
ALT: 21 U/L (ref 0–53)
AST: 19 U/L (ref 0–37)
Albumin: 4.2 g/dL (ref 3.5–5.2)
Alkaline Phosphatase: 97 U/L (ref 39–117)
BUN: 14 mg/dL (ref 6–23)
CO2: 30 mEq/L (ref 19–32)
Calcium: 10.1 mg/dL (ref 8.4–10.5)
Chloride: 102 mEq/L (ref 96–112)
Creatinine, Ser: 0.86 mg/dL (ref 0.40–1.50)
GFR: 88.73 mL/min (ref >60.00)
Glucose, Bld: 93 mg/dL (ref 70–99)
Potassium: 4.8 mEq/L (ref 3.5–5.1)
Sodium: 139 mEq/L (ref 135–145)
Total Bilirubin: 0.5 mg/dL (ref 0.2–1.2)
Total Protein: 8 g/dL (ref 6.0–8.3)

## 2023-01-10 LAB — LDL CHOLESTEROL, DIRECT: Direct LDL: 172 mg/dL

## 2023-01-10 LAB — HEMOGLOBIN A1C: Hgb A1c MFr Bld: 5.9 % (ref 4.6–6.5)

## 2023-01-10 MED ORDER — SIMVASTATIN 10 MG PO TABS: 10.0000 mg | ORAL_TABLET | Freq: Every day | ORAL | 3 refills | Status: DC

## 2023-01-10 MED ORDER — CEFUROXIME AXETIL 500 MG PO TABS: 500.0000 mg | ORAL_TABLET | Freq: Two times a day (BID) | ORAL | 0 refills | Status: AC

## 2023-01-10 NOTE — Assessment & Plan Note (Signed)
With history of recurrent UTIs Presently on Flomax 0.4 mg daily Needs urology evaluation. Referral placed today.

## 2023-01-10 NOTE — Assessment & Plan Note (Addendum)
With history of recurrent E. coli infections in the past. Not resistant to any antibiotics Most recent infection about 5 weeks ago Recommend to start Ceftin 500 mg twice a day for 7 days Needs urology evaluation Referral placed today.

## 2023-01-10 NOTE — Patient Instructions (Signed)
Urinary Tract Infection, Adult A urinary tract infection (UTI) is an infection of any part of the urinary tract. The urinary tract includes: The kidneys. The ureters. The bladder. The urethra. These organs make, store, and get rid of pee (urine) in the body. What are the causes? This infection is caused by germs (bacteria) in your genital area. These germs grow and cause swelling (inflammation) of your urinary tract. What increases the risk? The following factors may make you more likely to develop this condition: Using a small, thin tube (catheter) to drain pee. Not being able to control when you pee or poop (incontinence). Being male. If you are male, these things can increase the risk: Using these methods to prevent pregnancy: A medicine that kills sperm (spermicide). A device that blocks sperm (diaphragm). Having low levels of a male hormone (estrogen). Being pregnant. You are more likely to develop this condition if: You have genes that add to your risk. You are sexually active. You take antibiotic medicines. You have trouble peeing because of: A prostate that is bigger than normal, if you are male. A blockage in the part of your body that drains pee from the bladder. A kidney stone. A nerve condition that affects your bladder. Not getting enough to drink. Not peeing often enough. You have other conditions, such as: Diabetes. A weak disease-fighting system (immune system). Sickle cell disease. Gout. Injury of the spine. What are the signs or symptoms? Symptoms of this condition include: Needing to pee right away. Peeing small amounts often. Pain or burning when peeing. Blood in the pee. Pee that smells bad or not like normal. Trouble peeing. Pee that is cloudy. Fluid coming from the vagina, if you are male. Pain in the belly or lower back. Other symptoms include: Vomiting. Not feeling hungry. Feeling mixed up (confused). This may be the first symptom in  older adults. Being tired and grouchy (irritable). A fever. Watery poop (diarrhea). How is this treated? Taking antibiotic medicine. Taking other medicines. Drinking enough water. In some cases, you may need to see a specialist. Follow these instructions at home:  Medicines Take over-the-counter and prescription medicines only as told by your doctor. If you were prescribed an antibiotic medicine, take it as told by your doctor. Do not stop taking it even if you start to feel better. General instructions Make sure you: Pee until your bladder is empty. Do not hold pee for a long time. Empty your bladder after sex. Wipe from front to back after peeing or pooping if you are a male. Use each tissue one time when you wipe. Drink enough fluid to keep your pee pale yellow. Keep all follow-up visits. Contact a doctor if: You do not get better after 1-2 days. Your symptoms go away and then come back. Get help right away if: You have very bad back pain. You have very bad pain in your lower belly. You have a fever. You have chills. You feeling like you will vomit or you vomit. Summary A urinary tract infection (UTI) is an infection of any part of the urinary tract. This condition is caused by germs in your genital area. There are many risk factors for a UTI. Treatment includes antibiotic medicines. Drink enough fluid to keep your pee pale yellow. This information is not intended to replace advice given to you by your health care provider. Make sure you discuss any questions you have with your health care provider. Document Revised: 05/28/2020 Document Reviewed: 05/28/2020 Elsevier Patient Education    2023 [employer]  

## 2023-01-10 NOTE — Assessment & Plan Note (Signed)
Mostly E. coli infections in the past Sensitive to all antibiotics Needs urology evaluation Referral placed today.

## 2023-01-10 NOTE — Assessment & Plan Note (Signed)
Cardiovascular and cancer risk associated with smoking discussed. Smoking cessation advice given. 

## 2023-01-10 NOTE — Assessment & Plan Note (Signed)
Diet and nutrition discussed Lipid profile done today Presently not on medication.

## 2023-01-10 NOTE — Telephone Encounter (Signed)
CRITICAL VALUE STICKER  CRITICAL VALUE: 18.0 Hemoglobin   RECEIVER (on-site recipient of call): Verdis Frederickson  DATE & TIME NOTIFIED: 01/10/2023 @ 12:42 pm  MESSENGER (representative from lab): Santiago Glad  MD NOTIFIED: Yes  TIME OF NOTIFICATION: 12:43 pm   RESPONSE:  MD will notify pt.

## 2023-01-10 NOTE — Telephone Encounter (Signed)
Thank you Maria

## 2023-01-10 NOTE — Progress Notes (Signed)
[name] 67 y.o.   Chief Complaint  Patient presents with   New Patient (Initial Visit)    Patient is requesting a physical today, hx of UTI, current symptoms cloudy urine, bad urine smell     HISTORY OF PRESENT ILLNESS: This is a 67 y.o. male first visit to this office, here to establish care with me Has history of recurrent UTIs.  Today noticed cloudy and smelly urine History of BPH on Flomax Smoker.  History of dyslipidemia and prediabetes. Also history of GERD on Nexium No other complaints or medical concerns today  HPI   Prior to Admission medications   Medication Sig Start Date End Date Taking? Authorizing Provider  APPLE CIDER VINEGAR PO Take by mouth.   Yes [provider]  Ascorbic Acid (VITAMIN C) 1000 MG tablet Take 1,000 mg by mouth daily.   Yes [provider]  Calcium Carbonate-Vit D-Min (CALCIUM 1200 PO) Take by mouth.   Yes [provider]  esomeprazole (NEXIUM) 20 MG capsule Take 20 mg by mouth. Every 3 days   Yes [provider]  Niacin (VITAMIN B-3 PO) Take by mouth.   Yes [provider]  tamsulosin (FLOMAX) 0.4 MG CAPS capsule Take 1 capsule (0.4 mg total) by mouth daily. 02/20/22  Yes Waunita Schooner, MD  Zinc Sulfate (ZINC 15 PO) Take by mouth.   Yes [provider]  simvastatin (ZOCOR) 10 MG tablet Take 1 tablet (10 mg total) by mouth at bedtime. Patient not taking: Reported on 01/10/2023 12/16/21   Waunita Schooner, MD    No Known Allergies  Patient Active Problem List   Diagnosis Date Noted   Mass in neck 03/03/2022   Epidermoid cyst of ear 07/14/2021   Prediabetes 02/21/2021   Benign prostatic hyperplasia with urinary hesitancy 01/13/2021   Peripheral neuropathy 01/13/2021   Erectile dysfunction 01/06/2020   Lower urinary tract symptoms 01/06/2020   Gout 09/06/2014   Mixed hyperlipidemia 06/10/2010   GERD 06/10/2010   Hx of adenomatous polyp of colon 03/13/2008    Past Medical History:   Diagnosis Date   Barrett's esophagus 03/13/2008   short and biopsies 2022 without intestinal metaplasia so resolved, plus original diagnosis made on less than 1 cm of change   DJD (degenerative joint disease)    GERD (gastroesophageal reflux disease)    Gout 2016   diagnosed 2016   Hx of adenomatous polyp of colon 03/13/2008   Hyperlipidemia    Wears glasses     Past Surgical History:  Procedure Laterality Date   COLONOSCOPY  2016   TA, CG   ELBOW ARTHROSCOPY Left 11/14/2013   Procedure: ARTHROSCOPY LEFT ELBOW, SYNOVECTOMY, DEBRIDEMENT, REMOVAL OF LOOSE BODIES;  Surgeon: Ninetta Lights, MD;  Location: [facility];  Service: Orthopedics;  Laterality: Left;   KNEE ARTHROPLASTY  2008   right   KNEE ARTHROSCOPY  2012   left   NASAL POLYP EXCISION     SHOULDER ARTHROSCOPY  2011   left   SHOULDER ARTHROSCOPY W/ ROTATOR CUFF REPAIR  2008   right   SHOULDER INJECTION Left 11/14/2013   Procedure: SHOULDER INJECTION;  Surgeon: Ninetta Lights, MD;  Location: [facility];  Service: Orthopedics;  Laterality: Left;   TONSILLECTOMY     UPPER GASTROINTESTINAL ENDOSCOPY     VASECTOMY      Social History   Socioeconomic History   Marital status: Divorced    Spouse name: Not on file   Number of children:  3   Years of education: college   Highest education level: Not on file  Occupational History   Not on file  Tobacco Use   Smoking status: Some Days    Types: Cigars    Last attempt to quit: 11/12/2011    Years since quitting: 11.1   Smokeless tobacco: Never   Tobacco comments:    maybe a cigar when golfing  Vaping Use   Vaping Use: Never used  Substance and Sexual Activity   Alcohol use: Yes    Alcohol/week: 0.0 standard drinks of alcohol    Comment: rarely, beer once a month   Drug use: Never   Sexual activity: Yes    Birth control/protection: None  Other Topics Concern   Not on file  Social History Narrative   01/06/20   From: born in  [place], but in [place] since 1989   Living: with son [name]   Work: was working with [employer] - has been unemployed since [employer]      Family: has 3 children (son [name], Radiation protection practitioner; daughter - died, one adopted child      Enjoys: play golf and soccer (but has not been playing since Covid)      Exercise: golf, would like to go back to soccer, exercising occasionally   Diet: eats responsibly, cooks at home      Safety   Seat belts: Yes    Guns: Yes  and secure   Safe in relationships: Yes    Social Determinants of Health   Financial Resource Strain: Low Risk  (02/17/2022)   Overall Financial Resource Strain (CARDIA)    Difficulty of Paying Living Expenses: Not hard at all  Food Insecurity: No Food Insecurity (02/17/2022)   Hunger Vital Sign    Worried About Running Out of Food in the Last Year: Never true    [place] in the Last Year: Never true  Transportation Needs: No Transportation Needs (02/17/2022)   PRAPARE - Hydrologist (Medical): No    Lack of Transportation (Non-Medical): No  Physical Activity: Sufficiently Active (02/17/2022)   Exercise Vital Sign    Days of Exercise per Week: 3 days    Minutes of Exercise per Session: 150+ min  Stress: No Stress Concern Present (02/17/2022)   [place]    Feeling of Stress : Not at all  Social Connections: Socially Isolated (02/17/2022)   Social Connection and Isolation Panel [NHANES]    Frequency of Communication with Friends and Family: More than three times a week    Frequency of Social Gatherings with Friends and Family: More than three times a week    Attends Religious Services: Never    Marine scientist or Organizations: No    Attends Archivist Meetings: Never    Marital Status: Divorced  Human resources officer Violence: Not At Risk (02/17/2022)   Humiliation, Afraid, Rape, and Kick questionnaire    Fear of Current or Ex-Partner:  No    Emotionally Abused: No    Physically Abused: No    Sexually Abused: No    Family History  Problem Relation Age of Onset   Colon polyps Mother    Colon cancer Mother 31   Lung cancer Father    Heart disease Brother 81       had bypass surgery   Esophageal cancer Neg Hx    Rectal cancer Neg Hx    Stomach  cancer Neg Hx      Review of Systems  Constitutional: Negative.   HENT: Negative.  Negative for congestion and sore throat.   Respiratory: Negative.  Negative for cough and hemoptysis.   Cardiovascular: Negative.  Negative for chest pain and palpitations.  Gastrointestinal:  Negative for abdominal pain, diarrhea, nausea and vomiting.  Genitourinary: Negative.  Negative for dysuria and hematuria.  Skin: Negative.  Negative for rash.  Neurological: Negative.  Negative for dizziness and headaches.  All other systems reviewed and are negative.   Vitals:   01/10/23 1021  BP: 110/74  Pulse: 90  Temp: 98.1 F (36.7 C)  SpO2: 93%    Physical Exam Vitals reviewed.  Constitutional:      Appearance: Normal appearance.  HENT:     Head: Normocephalic.     Right Ear: Tympanic membrane, ear canal and external ear normal.     Left Ear: Tympanic membrane, ear canal and external ear normal.     Mouth/Throat:     Mouth: Mucous membranes are moist.     Pharynx: Oropharynx is clear.  Eyes:     Extraocular Movements: Extraocular movements intact.     Pupils: Pupils are equal, round, and reactive to light.  Cardiovascular:     Rate and Rhythm: Normal rate and regular rhythm.     Pulses: Normal pulses.     Heart sounds: Normal heart sounds.  Pulmonary:     Effort: Pulmonary effort is normal.     Breath sounds: Normal breath sounds.  Abdominal:     Palpations: Abdomen is soft.     Tenderness: There is no abdominal tenderness.  Musculoskeletal:     Cervical back: No tenderness.  Lymphadenopathy:     Cervical: No cervical adenopathy.  Skin:    General: Skin is warm and  dry.  Neurological:     General: No focal deficit present.     Mental Status: He is alert and oriented to person, place, and time.  Psychiatric:        Mood and Affect: Mood normal.        Behavior: Behavior normal.    Results for orders placed or performed in visit on 01/10/23 (from the past 24 hour(s))  POCT Urinalysis Dipstick     Status: Abnormal   Collection Time: 01/10/23 10:35 AM  Result Value Ref Range   Color, UA yellow    Clarity, UA clear    Glucose, UA Negative Negative   Bilirubin, UA negative    Ketones, UA negative    Spec Grav, UA <=1.005 (A) 1.010 - 1.025   Blood, UA negative    pH, UA 6.0 5.0 - 8.0   Protein, UA Negative Negative   Urobilinogen, UA 0.2 0.2 or 1.0 E.U./dL   Nitrite, UA negative    Leukocytes, UA Moderate (2+) (A) Negative   Appearance     Odor       ASSESSMENT & PLAN: A total of 48 minutes was spent with the patient and counseling/coordination of care regarding preparing for this visit, review of available medical records, establishing care with me, comprehensive history and physical examination, review of multiple chronic medical conditions under management, review of all medications, diagnosis of acute UTI and need to start antibiotics, need for urology evaluation, education on nutrition, smoking cessation, prognosis, documentation and need for follow-up.  Problem List Items Addressed This Visit       Genitourinary   Benign prostatic hyperplasia with urinary hesitancy    With history  of recurrent UTIs Presently on Flomax 0.4 mg daily Needs urology evaluation. Referral placed today.      Relevant Orders   Ambulatory referral to Urology   Acute UTI - Primary    With history of recurrent E. coli infections in the past. Not resistant to any antibiotics Most recent infection about 5 weeks ago Recommend to start Ceftin 500 mg twice a day for 7 days Needs urology evaluation Referral placed today.      Relevant Medications    cefUROXime (CEFTIN) 500 MG tablet   Other Relevant Orders   CBC with Differential/Platelet     Other   Lower urinary tract symptoms    Secondary to BPH. On Flomax 0.4 mg daily.      Prediabetes    Diet and nutrition discussed. Hemoglobin A1c done today. Not on any medication      Relevant Orders   Comprehensive metabolic panel   Hemoglobin A1c   History of recurrent UTIs    Mostly E. coli infections in the past Sensitive to all antibiotics Needs urology evaluation Referral placed today.      Relevant Orders   POCT Urinalysis Dipstick (Completed)   Urine Culture   Ambulatory referral to Urology   Dyslipidemia    Diet and nutrition discussed Lipid profile done today Presently not on medication.      Relevant Orders   Comprehensive metabolic panel   Lipid panel   Current smoker    Cardiovascular and cancer risk associated with smoking discussed. Smoking cessation advice given.      Other Visit Diagnoses     Encounter to establish care          Patient Instructions  Urinary Tract Infection, Adult A urinary tract infection (UTI) is an infection of any part of the urinary tract. The urinary tract includes: The kidneys. The ureters. The bladder. The urethra. These organs make, store, and get rid of pee (urine) in the body. What are the causes? This infection is caused by germs (bacteria) in your genital area. These germs grow and cause swelling (inflammation) of your urinary tract. What increases the risk? The following factors may make you more likely to develop this condition: Using a small, thin tube (catheter) to drain pee. Not being able to control when you pee or poop (incontinence). Being male. If you are male, these things can increase the risk: Using these methods to prevent pregnancy: A medicine that kills sperm (spermicide). A device that blocks sperm (diaphragm). Having low levels of a male hormone (estrogen). Being pregnant. You are  more likely to develop this condition if: You have genes that add to your risk. You are sexually active. You take antibiotic medicines. You have trouble peeing because of: A prostate that is bigger than normal, if you are male. A blockage in the part of your body that drains pee from the bladder. A kidney stone. A nerve condition that affects your bladder. Not getting enough to drink. Not peeing often enough. You have other conditions, such as: Diabetes. A weak disease-fighting system (immune system). Sickle cell disease. Gout. Injury of the spine. What are the signs or symptoms? Symptoms of this condition include: Needing to pee right away. Peeing small amounts often. Pain or burning when peeing. Blood in the pee. Pee that smells bad or not like normal. Trouble peeing. Pee that is cloudy. Fluid coming from the vagina, if you are male. Pain in the belly or lower back. Other symptoms include: Vomiting. Not feeling hungry.  Feeling mixed up (confused). This may be the first symptom in older adults. Being tired and grouchy (irritable). A fever. Watery poop (diarrhea). How is this treated? Taking antibiotic medicine. Taking other medicines. Drinking enough water. In some cases, you may need to see a specialist. Follow these instructions at home:  Medicines Take over-the-counter and prescription medicines only as told by your doctor. If you were prescribed an antibiotic medicine, take it as told by your doctor. Do not stop taking it even if you start to feel better. General instructions Make sure you: Pee until your bladder is empty. Do not hold pee for a long time. Empty your bladder after sex. Wipe from front to back after peeing or pooping if you are a male. Use each tissue one time when you wipe. Drink enough fluid to keep your pee pale yellow. Keep all follow-up visits. Contact a doctor if: You do not get better after 1-2 days. Your symptoms go away and then  come back. Get help right away if: You have very bad back pain. You have very bad pain in your lower belly. You have a fever. You have chills. You feeling like you will vomit or you vomit. Summary A urinary tract infection (UTI) is an infection of any part of the urinary tract. This condition is caused by germs in your genital area. There are many risk factors for a UTI. Treatment includes antibiotic medicines. Drink enough fluid to keep your pee pale yellow. This information is not intended to replace advice given to you by your health care provider. Make sure you discuss any questions you have with your health care provider. Document Revised: 05/28/2020 Document Reviewed: 05/28/2020 Elsevier Patient Education  [place], MD [facility]

## 2023-01-10 NOTE — Assessment & Plan Note (Signed)
Diet and nutrition discussed. Hemoglobin A1c done today. Not on any medication

## 2023-01-10 NOTE — Assessment & Plan Note (Signed)
Secondary to BPH. On Flomax 0.4 mg daily.

## 2023-01-12 LAB — URINE CULTURE

## 2023-01-25 ENCOUNTER — Telehealth: Payer: Self-pay | Admitting: Emergency Medicine

## 2023-01-25 NOTE — Telephone Encounter (Signed)
Contacted Bryce Cameron to schedule their annual wellness visit. Appointment made for 02/20/2023.  [place] Direct Dial: [phone]

## 2023-02-07 ENCOUNTER — Ambulatory Visit: Payer: Medicare PPO | Admitting: Urology

## 2023-02-07 ENCOUNTER — Encounter: Payer: Self-pay | Admitting: Urology

## 2023-02-07 VITALS — BP 122/76 | HR 105 | Ht 69.0 in | Wt 176.0 lb

## 2023-02-07 DIAGNOSIS — N529 Male erectile dysfunction, unspecified: Secondary | ICD-10-CM | POA: Diagnosis not present

## 2023-02-07 DIAGNOSIS — R3911 Hesitancy of micturition: Secondary | ICD-10-CM | POA: Diagnosis not present

## 2023-02-07 DIAGNOSIS — N401 Enlarged prostate with lower urinary tract symptoms: Secondary | ICD-10-CM | POA: Diagnosis not present

## 2023-02-07 DIAGNOSIS — N390 Urinary tract infection, site not specified: Secondary | ICD-10-CM | POA: Diagnosis not present

## 2023-02-07 LAB — MICROSCOPIC EXAMINATION: Epithelial Cells (non renal): >10 /hpf — AB (ref 0–10)

## 2023-02-07 LAB — URINALYSIS, COMPLETE
Bilirubin, UA: NEGATIVE
Glucose, UA: NEGATIVE
Ketones, UA: NEGATIVE
Nitrite, UA: NEGATIVE
Protein,UA: NEGATIVE
Specific Gravity, UA: 1.01 (ref 1.005–1.030)
Urobilinogen, Ur: 0.2 mg/dL (ref 0.2–1.0)
pH, UA: 6 (ref 5.0–7.5)

## 2023-02-07 LAB — BLADDER SCAN AMB NON-IMAGING: Scan Result: 0

## 2023-02-07 MED ORDER — TADALAFIL 20 MG PO TABS: ORAL_TABLET | ORAL | 0 refills | Status: DC

## 2023-02-07 NOTE — Progress Notes (Signed)
I, Bryce Cameron,acting as a scribe for Bryce Altes, MD.,have documented all relevant documentation on the behalf of Bryce Altes, MD,as directed by  Bryce Altes, MD while in the presence of Bryce Altes, MD.  02/07/2023 5:24 PM   Bryce Cameron [birth year] [id number]  Referring provider: Georgina Quint, MD [address] [place],  [place] [address]  Chief Complaint  Patient presents with   Benign Prostatic Hypertrophy    HPI: Bryce Cameron is a 67 y.o. male new patient referral for evaluation of BPH and recurrent UTI.  Three positive urine cultures for E. Coli: November 2023, February 2024, and March 2024. He states the longest he has been treated with each infection is a seven day course of antibiotics.  UTI symptoms include frequency, urgency, dysuria, and hematospermia.  Started on Tamsulosin when he had his first infection. No bothersome lower urinary tract symptoms after antibiotic course is completed.  UA: trace blood/ 1+ leukocytes; dipstick microscopy 6-10 WBC's/ 3-10 RBC. Also complains of difficulty achieving and maintaining an erection. Has tried Cialis and Sildenafil in the past.  He had significant facial flushing and headache with Sildenafil.   PMH: Past Medical History:  Diagnosis Date   Barrett's esophagus 03/13/2008   short and biopsies 2022 without intestinal metaplasia so resolved, plus original diagnosis made on less than 1 cm of change   DJD (degenerative joint disease)    GERD (gastroesophageal reflux disease)    Gout 2016   diagnosed 2016   Hx of adenomatous polyp of colon 03/13/2008   Hyperlipidemia    Wears glasses     Surgical History: Past Surgical History:  Procedure Laterality Date   COLONOSCOPY  2016   TA, CG   ELBOW ARTHROSCOPY Left 11/14/2013   Procedure: ARTHROSCOPY LEFT ELBOW, SYNOVECTOMY, DEBRIDEMENT, REMOVAL OF LOOSE BODIES;  Surgeon: Loreta Ave, MD;  Location: [facility];  Service:  Orthopedics;  Laterality: Left;   KNEE ARTHROPLASTY  2008   right   KNEE ARTHROSCOPY  2012   left   NASAL POLYP EXCISION     SHOULDER ARTHROSCOPY  2011   left   SHOULDER ARTHROSCOPY W/ ROTATOR CUFF REPAIR  2008   right   SHOULDER INJECTION Left 11/14/2013   Procedure: SHOULDER INJECTION;  Surgeon: Loreta Ave, MD;  Location: [facility];  Service: Orthopedics;  Laterality: Left;   TONSILLECTOMY     UPPER GASTROINTESTINAL ENDOSCOPY     VASECTOMY      Home Medications:  Allergies as of 02/07/2023   No Known Allergies      Medication List        Accurate as of February 07, 2023  5:24 PM. If you have any questions, ask your nurse or doctor.          APPLE CIDER VINEGAR PO Take by mouth.   CALCIUM 1200 PO Take by mouth.   esomeprazole 20 MG capsule Commonly known as: NEXIUM Take 20 mg by mouth. Every 3 days   simvastatin 10 MG tablet Commonly known as: ZOCOR Take 1 tablet (10 mg total) by mouth at bedtime.   tadalafil 20 MG tablet Commonly known as: CIALIS Take 1 hour prior to intercourse. Started by: Bryce Altes, MD   tamsulosin 0.4 MG Caps capsule Commonly known as: FLOMAX Take 1 capsule (0.4 mg total) by mouth daily.   VITAMIN B-3 PO Take by mouth.   vitamin C 1000 MG tablet Take 1,000 mg by mouth  daily.   ZINC 15 PO Take by mouth.        Family History: Family History  Problem Relation Age of Onset   Colon polyps Mother    Colon cancer Mother 45   Lung cancer Father    Heart disease Brother 86       had bypass surgery   Esophageal cancer Neg Hx    Rectal cancer Neg Hx    Stomach cancer Neg Hx     Social History:  reports that he has been smoking cigars. He has never used smokeless tobacco. He reports current alcohol use. He reports that he does not use drugs.   Physical Exam: BP 122/76   Pulse (!) 105   Ht 5\' 9"  (1.753 m)   Wt 176 lb (79.8 kg)   BMI 25.99 kg/m   Constitutional:  Alert and oriented, No acute  distress. HEENT: [place] AT, moist mucus membranes.  Trachea midline, no masses. Cardiovascular: No clubbing, cyanosis, or edema. Respiratory: Normal respiratory effort, no increased work of breathing. GI: Abdomen is soft, nontender, nondistended, no abdominal masses GU:  Prostate 40 grams, smooth without nodules Skin: No rashes, bruises or suspicious lesions. Neurologic: Grossly intact, no focal deficits, moving all 4 extremities. Psychiatric: Normal mood and affect.  Laboratory Data: Lab Results  Component Value Date   WBC 12.9 (H) 01/10/2023   HGB 18.0 Repeated and verified X2. (HH) 01/10/2023   HCT 52.3 (H) 01/10/2023   MCV 94.1 01/10/2023   PLT 347.0 01/10/2023    Lab Results  Component Value Date   CREATININE 0.86 01/10/2023    Lab Results  Component Value Date   PSA 0.90 02/20/2022   PSA 1.01 01/13/2021   PSA 0.88 06/10/2010     Lab Results  Component Value Date   HGBA1C 5.9 01/10/2023    Urinalysis    Component Value Date/Time   APPEARANCEUR Clear 02/07/2023 1337   GLUCOSEU Negative 02/07/2023 1337   BILIRUBINUR Negative 02/07/2023 1337   KETONESUR negative 12/13/2022 1234   PROTEINUR Negative 02/07/2023 1337   UROBILINOGEN 0.2 01/10/2023 1035   NITRITE Negative 02/07/2023 1337   LEUKOCYTESUR 1+ (A) 02/07/2023 1337    Lab Results  Component Value Date   LABMICR See below: 02/07/2023   WBCUA 6-10 (A) 02/07/2023   LABEPIT >10 (A) 02/07/2023   BACTERIA Moderate (A) 02/07/2023     Assessment & Plan:    Recurrent UTI We discussed most urinary tract infections in men are secondary to prostatitis. A 7-day course of antibiotics is not considered adequate treatment due to high risk of recurrent bacterial prostatitis.  Recommend a 28-day course of Septra DS one twice daily.  Renal ultrasound to assess upper tracts.  2. Erectile dysfunction Would like a repeat trial of Tadalafil 20 milligrams; take one hour prior to intercourse.  Rx sent to  pharmacy. Follow up one month for symptom recheck.    [facility] [address], [address] [place], [place] [address] [phone]

## 2023-02-08 MED ORDER — SULFAMETHOXAZOLE-TRIMETHOPRIM 800-160 MG PO TABS
1.0000 | ORAL_TABLET | Freq: Two times a day (BID) | ORAL | 0 refills | Status: AC
Start: 2023-02-08 — End: 2023-03-08

## 2023-02-10 LAB — CULTURE, URINE COMPREHENSIVE

## 2023-02-14 ENCOUNTER — Ambulatory Visit
Admission: RE | Admit: 2023-02-14 | Discharge: 2023-02-14 | Disposition: A | Discharge status: Home / Self Care | Payer: Medicare PPO | Source: Ambulatory Visit | Attending: Urology | Admitting: Urology

## 2023-02-14 DIAGNOSIS — N390 Urinary tract infection, site not specified: Secondary | ICD-10-CM | POA: Insufficient documentation

## 2023-02-15 ENCOUNTER — Encounter: Payer: Self-pay | Admitting: *Deleted

## 2023-02-21 ENCOUNTER — Ambulatory Visit (INDEPENDENT_AMBULATORY_CARE_PROVIDER_SITE_OTHER): Payer: Medicare PPO

## 2023-02-21 VITALS — Ht 69.0 in | Wt 176.0 lb

## 2023-02-21 DIAGNOSIS — Z0000 Encounter for general adult medical examination without abnormal findings: Secondary | ICD-10-CM | POA: Diagnosis not present

## 2023-02-21 NOTE — Patient Instructions (Addendum)
Bryce Cameron , Thank you for taking time to come for your Medicare Wellness Visit. I appreciate your ongoing commitment to your health goals. Please review the following plan we discussed and let me know if I can assist you in the future.   These are the goals we discussed:  Goals       Increase physical activity (pt-stated)      Continue to play Golf.      Patient Stated      02/16/2021, I will continue to play golf 4 days a week for 4 hours.       Stay Healthy (pt-stated)      Stay happy!        This is a list of the screening recommended for you and due dates:  Health Maintenance  Topic Date Due   COVID-19 Vaccine (6 - 2023-24 season) 03/09/2023*   Zoster (Shingles) Vaccine (1 of 2) 04/12/2023*   Pneumonia Vaccine (1 of 2 - PCV) 02/21/2024*   Flu Shot  05/31/2023   Medicare Annual Wellness Visit  02/21/2024   Colon Cancer Screening  05/20/2028   Hepatitis C Screening: USPSTF Recommendation to screen - Ages 18-79 yo.  Completed   HPV Vaccine  Aged Out   DTaP/Tdap/Td vaccine  Discontinued  *Topic was postponed. The date shown is not the original due date.    Advanced directives: Advance directive discussed with you today. Even though you declined this today, please call our office should you change your mind, and we can give you the proper paperwork for you to fill out.   Conditions/risks identified: None  Next appointment: Follow up in one year for your annual wellness visit.   Preventive Care 48 Years and Older, Male  Preventive care refers to lifestyle choices and visits with your health care provider that can promote health and wellness. What does preventive care include? A yearly physical exam. This is also called an annual well check. Dental exams once or twice a year. Routine eye exams. Ask your health care provider how often you should have your eyes checked. Personal lifestyle choices, including: Daily care of your teeth and gums. Regular physical  activity. Eating a healthy diet. Avoiding tobacco and drug use. Limiting alcohol use. Practicing safe sex. Taking low doses of aspirin every day. Taking vitamin and mineral supplements as recommended by your health care provider. What happens during an annual well check? The services and screenings done by your health care provider during your annual well check will depend on your age, overall health, lifestyle risk factors, and family history of disease. Counseling  Your health care provider may ask you questions about your: Alcohol use. Tobacco use. Drug use. Emotional well-being. Home and relationship well-being. Sexual activity. Eating habits. History of falls. Memory and ability to understand (cognition). Work and work Astronomer. Screening  You may have the following tests or measurements: Height, weight, and BMI. Blood pressure. Lipid and cholesterol levels. These may be checked every 5 years, or more frequently if you are over 67 years old. Skin check. Lung cancer screening. You may have this screening every year starting at age 47 if you have a 30-pack-year history of smoking and currently smoke or have quit within the past 15 years. Fecal occult blood test (FOBT) of the stool. You may have this test every year starting at age 71. Flexible sigmoidoscopy or colonoscopy. You may have a sigmoidoscopy every 5 years or a colonoscopy every 10 years starting at age 13. Prostate cancer screening.  Recommendations will vary depending on your family history and other risks. Hepatitis C blood test. Hepatitis B blood test. Sexually transmitted disease (STD) testing. Diabetes screening. This is done by checking your blood sugar (glucose) after you have not eaten for a while (fasting). You may have this done every 1-3 years. Abdominal aortic aneurysm (AAA) screening. You may need this if you are a current or former smoker. Osteoporosis. You may be screened starting at age 91 if you are  at high risk. Talk with your health care provider about your test results, treatment options, and if necessary, the need for more tests. Vaccines  Your health care provider may recommend certain vaccines, such as: Influenza vaccine. This is recommended every year. Tetanus, diphtheria, and acellular pertussis (Tdap, Td) vaccine. You may need a Td booster every 10 years. Zoster vaccine. You may need this after age 31. Pneumococcal 13-valent conjugate (PCV13) vaccine. One dose is recommended after age 47. Pneumococcal polysaccharide (PPSV23) vaccine. One dose is recommended after age 46. Talk to your health care provider about which screenings and vaccines you need and how often you need them. This information is not intended to replace advice given to you by your health care provider. Make sure you discuss any questions you have with your health care provider. Document Released: 11/12/2015 Document Revised: 07/05/2016 Document Reviewed: 08/17/2015 Elsevier Interactive Patient Education  2017 [employer] Prevention in the Home Falls can cause injuries. They can happen to people of all ages. There are many things you can do to make your home safe and to help prevent falls. What can I do on the outside of my home? Regularly fix the edges of walkways and driveways and fix any cracks. Remove anything that might make you trip as you walk through a door, such as a raised step or threshold. Trim any bushes or trees on the path to your home. Use bright outdoor lighting. Clear any walking paths of anything that might make someone trip, such as rocks or tools. Regularly check to see if handrails are loose or broken. Make sure that both sides of any steps have handrails. Any raised decks and porches should have guardrails on the edges. Have any leaves, snow, or ice cleared regularly. Use sand or salt on walking paths during winter. Clean up any spills in your garage right away. This includes oil  or grease spills. What can I do in the bathroom? Use night lights. Install grab bars by the toilet and in the tub and shower. Do not use towel bars as grab bars. Use non-skid mats or decals in the tub or shower. If you need to sit down in the shower, use a plastic, non-slip stool. Keep the floor dry. Clean up any water that spills on the floor as soon as it happens. Remove soap buildup in the tub or shower regularly. Attach bath mats securely with double-sided non-slip rug tape. Do not have throw rugs and other things on the floor that can make you trip. What can I do in the bedroom? Use night lights. Make sure that you have a light by your bed that is easy to reach. Do not use any sheets or blankets that are too big for your bed. They should not hang down onto the floor. Have a firm chair that has side arms. You can use this for support while you get dressed. Do not have throw rugs and other things on the floor that can make you trip. What can I  do in the kitchen? Clean up any spills right away. Avoid walking on wet floors. Keep items that you use a lot in easy-to-reach places. If you need to reach something above you, use a strong step stool that has a grab bar. Keep electrical cords out of the way. Do not use floor polish or wax that makes floors slippery. If you must use wax, use non-skid floor wax. Do not have throw rugs and other things on the floor that can make you trip. What can I do with my stairs? Do not leave any items on the stairs. Make sure that there are handrails on both sides of the stairs and use them. Fix handrails that are broken or loose. Make sure that handrails are as long as the stairways. Check any carpeting to make sure that it is firmly attached to the stairs. Fix any carpet that is loose or worn. Avoid having throw rugs at the top or bottom of the stairs. If you do have throw rugs, attach them to the floor with carpet tape. Make sure that you have a light  switch at the top of the stairs and the bottom of the stairs. If you do not have them, ask someone to add them for you. What else can I do to help prevent falls? Wear shoes that: Do not have high heels. Have rubber bottoms. Are comfortable and fit you well. Are closed at the toe. Do not wear sandals. If you use a stepladder: Make sure that it is fully opened. Do not climb a closed stepladder. Make sure that both sides of the stepladder are locked into place. Ask someone to hold it for you, if possible. Clearly mark and make sure that you can see: Any grab bars or handrails. First and last steps. Where the edge of each step is. Use tools that help you move around (mobility aids) if they are needed. These include: Canes. Walkers. Scooters. Crutches. Turn on the lights when you go into a dark area. Replace any light bulbs as soon as they burn out. Set up your furniture so you have a clear path. Avoid moving your furniture around. If any of your floors are uneven, fix them. If there are any pets around you, be aware of where they are. Review your medicines with your doctor. Some medicines can make you feel dizzy. This can increase your chance of falling. Ask your doctor what other things that you can do to help prevent falls. This information is not intended to replace advice given to you by your health care provider. Make sure you discuss any questions you have with your health care provider. Document Released: 08/12/2009 Document Revised: 03/23/2016 Document Reviewed: 11/20/2014 Elsevier Interactive Patient Education  2017 [employer].

## 2023-02-21 NOTE — Progress Notes (Signed)
Subjective:   Bryce Cameron is a 67 y.o. male who presents for Medicare Annual/Subsequent preventive examination.  Review of Systems    Virtual Visit via Telephone Note  I connected with  Bryce Cameron on 02/21/23 at  8:45 AM EDT by telephone and verified that I am speaking with the correct person using two identifiers.  Location: Patient: Home  Provider: Office Persons participating in the virtual visit: patient/Nurse Health Advisor   I discussed the limitations, risks, security and privacy concerns of performing an evaluation and management service by telephone and the availability of in person appointments. The patient expressed understanding and agreed to proceed.  Interactive audio and video telecommunications were attempted between this nurse and patient, however failed, due to patient having technical difficulties OR patient did not have access to video capability.  We continued and completed visit with audio only.  Some vital signs may be absent or patient reported.   Bryce Rung, LPN  Cardiac Risk Factors include: advanced age (>59men, >77 women);male gender;dyslipidemia     Objective:    Today's Vitals   02/21/23 0853  Weight: 176 lb (79.8 kg)  Height: 5\' 9"  (1.753 m)   Body mass index is 25.99 kg/m.     02/21/2023    8:58 AM 12/13/2022   12:40 PM 09/07/2022    9:22 AM 02/17/2022    9:42 AM 02/16/2021    9:40 AM 04/30/2015    7:58 AM 04/15/2015    9:02 AM  Advanced Directives  Does Patient Have a Medical Advance Directive? No No No No No No No  Does patient want to make changes to medical advance directive?    No - Patient declined     Copy of Healthcare Power of Attorney in Chart?    No - copy requested     Would patient like information on creating a medical advance directive? No - Patient declined   No - Patient declined No - Patient declined No - patient declined information     Current Medications (verified) Outpatient Encounter Medications as of  02/21/2023  Medication Sig   APPLE CIDER VINEGAR PO Take by mouth.   Ascorbic Acid (VITAMIN C) 1000 MG tablet Take 1,000 mg by mouth daily.   Calcium Carbonate-Vit D-Min (CALCIUM 1200 PO) Take by mouth.   esomeprazole (NEXIUM) 20 MG capsule Take 20 mg by mouth. Every 3 days   Niacin (VITAMIN B-3 PO) Take by mouth.   simvastatin (ZOCOR) 10 MG tablet Take 1 tablet (10 mg total) by mouth at bedtime.   sulfamethoxazole-trimethoprim (BACTRIM DS) 800-160 MG tablet Take 1 tablet by mouth 2 (two) times daily for 28 days.   tadalafil (CIALIS) 20 MG tablet Take 1 hour prior to intercourse.   tamsulosin (FLOMAX) 0.4 MG CAPS capsule Take 1 capsule (0.4 mg total) by mouth daily.   Zinc Sulfate (ZINC 15 PO) Take by mouth.   No facility-administered encounter medications on file as of 02/21/2023.    Allergies (verified) Patient has no known allergies.   History: Past Medical History:  Diagnosis Date   Barrett's esophagus 03/13/2008   short and biopsies 2022 without intestinal metaplasia so resolved, plus original diagnosis made on less than 1 cm of change   DJD (degenerative joint disease)    GERD (gastroesophageal reflux disease)    Gout 2016   diagnosed 2016   Hx of adenomatous polyp of colon 03/13/2008   Hyperlipidemia    Wears glasses    Past Surgical History:  Procedure  Laterality Date   COLONOSCOPY  2016   TA, CG   ELBOW ARTHROSCOPY Left 11/14/2013   Procedure: ARTHROSCOPY LEFT ELBOW, SYNOVECTOMY, DEBRIDEMENT, REMOVAL OF LOOSE BODIES;  Surgeon: Loreta Ave, MD;  Location: [facility];  Service: Orthopedics;  Laterality: Left;   KNEE ARTHROPLASTY  2008   right   KNEE ARTHROSCOPY  2012   left   NASAL POLYP EXCISION     SHOULDER ARTHROSCOPY  2011   left   SHOULDER ARTHROSCOPY W/ ROTATOR CUFF REPAIR  2008   right   SHOULDER INJECTION Left 11/14/2013   Procedure: SHOULDER INJECTION;  Surgeon: Loreta Ave, MD;  Location: [facility];  Service:  Orthopedics;  Laterality: Left;   TONSILLECTOMY     UPPER GASTROINTESTINAL ENDOSCOPY     VASECTOMY     Family History  Problem Relation Age of Onset   Colon polyps Mother    Colon cancer Mother 58   Lung cancer Father    Heart disease Brother 96       had bypass surgery   Esophageal cancer Neg Hx    Rectal cancer Neg Hx    Stomach cancer Neg Hx    Social History   Socioeconomic History   Marital status: Divorced    Spouse name: Not on file   Number of children: 3   Years of education: college   Highest education level: Not on file  Occupational History   Not on file  Tobacco Use   Smoking status: Some Days    Types: Cigars    Last attempt to quit: 11/12/2011    Years since quitting: 11.2   Smokeless tobacco: Never   Tobacco comments:    maybe a cigar when golfing  Vaping Use   Vaping Use: Never used  Substance and Sexual Activity   Alcohol use: Yes    Alcohol/week: 0.0 standard drinks of alcohol    Comment: rarely, beer once a month   Drug use: Never   Sexual activity: Yes    Birth control/protection: None  Other Topics Concern   Not on file  Social History Narrative   01/06/20   From: born in [place], but in [place] since 1989   Living: with son Lisabeth Register   Work: was working with [employer] - has been unemployed since [employer]      Family: has 3 children (son Lisabeth Register, Designer, multimedia; daughter - died, one adopted child      Enjoys: play golf and soccer (but has not been playing since Covid)      Exercise: golf, would like to go back to soccer, exercising occasionally   Diet: eats responsibly, cooks at home      Safety   Seat belts: Yes    Guns: Yes  and secure   Safe in relationships: Yes    Social Determinants of Health   Financial Resource Strain: Low Risk  (02/21/2023)   Overall Financial Resource Strain (CARDIA)    Difficulty of Paying Living Expenses: Not hard at all  Food Insecurity: No Food Insecurity (02/21/2023)   Hunger Vital Sign    Worried About Running Out of  Food in the Last Year: Never true    Ran Out of Food in the Last Year: Never true  Transportation Needs: No Transportation Needs (02/21/2023)   PRAPARE - Administrator, Civil Service (Medical): No    Lack of Transportation (Non-Medical): No  Physical Activity: Sufficiently Active (02/21/2023)   Exercise Vital Sign  Days of Exercise per Week: 5 days    Minutes of Exercise per Session: 60 min  Stress: No Stress Concern Present (02/21/2023)   [employer] of Occupational Health - Occupational Stress Questionnaire    Feeling of Stress : Not at all  Social Connections: Moderately Integrated (02/21/2023)   Social Connection and Isolation Panel [NHANES]    Frequency of Communication with Friends and Family: More than three times a week    Frequency of Social Gatherings with Friends and Family: More than three times a week    Attends Religious Services: More than 4 times per year    Active Member of [employer] or Organizations: Yes    Attends Engineer, structural: More than 4 times per year    Marital Status: Divorced    Tobacco Counseling Ready to quit: No Counseling given: Yes Tobacco comments: maybe a cigar when golfing   Clinical Intake:  Pre-visit preparation completed: Yes  Pain : No/denies pain     BMI - recorded: 25.99 Nutritional Status: BMI 25 -29 Overweight Nutritional Risks: None Diabetes: No  How often do you need to have someone help you when you read instructions, pamphlets, or other written materials from your doctor or pharmacy?: 1 - Never  Diabetic?  No  Interpreter Needed?: No  Information entered by :: Theresa Mulligan LPN   Activities of Daily Living    02/21/2023    8:57 AM 02/20/2023    9:45 AM  In your present state of health, do you have any difficulty performing the following activities:  Hearing? 0 0  Vision? 0 0  Difficulty concentrating or making decisions? 0 0  Walking or climbing stairs? 0 0  Dressing or bathing? 0 0   Doing errands, shopping? 0 0  Preparing Food and eating ? N N  Using the Toilet? N N  In the past six months, have you accidently leaked urine? N N  Do you have problems with loss of bowel control? N N  Managing your Medications? N N  Managing your Finances? N N  Housekeeping or managing your Housekeeping? N N    Patient Care Team: Georgina Quint, MD as PCP - General (Internal Medicine)  Indicate any recent Medical Services you may have received from other than [facility] providers in the past year (date may be approximate).     Assessment:   This is a routine wellness examination for Ramirez.  Hearing/Vision screen Hearing Screening - Comments:: Denies hearing difficulties   Vision Screening - Comments:: Wears rx glasses - up to date with routine eye exams with  Dr Shea Evans  Dietary issues and exercise activities discussed: Current Exercise Habits: Home exercise routine, Type of exercise: walking, Time (Minutes): 60, Frequency (Times/Week): 5, Weekly Exercise (Minutes/Week): 300, Intensity: Moderate, Exercise limited by: None identified   Goals Addressed               This Visit's Progress     Stay Healthy (pt-stated)        Stay happy!       Depression Screen    02/21/2023    8:56 AM 01/10/2023   10:22 AM 02/17/2022    9:38 AM 02/16/2021    9:42 AM 01/06/2020    9:14 AM  PHQ 2/9 Scores  PHQ - 2 Score 0 3 0 0 0  PHQ- 9 Score 0 9  0     Fall Risk    02/21/2023    8:57 AM 02/20/2023    9:45  AM 01/10/2023   10:21 AM 02/17/2022    9:41 AM 02/16/2021    9:42 AM  Fall Risk   Falls in the past year? 0 0 0 0 0  Number falls in past yr: 0  0 0 0  Injury with Fall? 0  0 0 0  Risk for fall due to : No Fall Risks  No Fall Risks No Fall Risks No Fall Risks  Follow up Falls prevention discussed  Falls evaluation completed  Falls evaluation completed;Falls prevention discussed    FALL RISK PREVENTION PERTAINING TO THE HOME:  Any stairs in or around the home? Yes  If so, are  there any without handrails? No  Home free of loose throw rugs in walkways, pet beds, electrical cords, etc? Yes  Adequate lighting in your home to reduce risk of falls? Yes   ASSISTIVE DEVICES UTILIZED TO PREVENT FALLS:  Life alert? No  Use of a cane, walker or w/c? No  Grab bars in the bathroom? Yes  Shower chair or bench in shower? No  Elevated toilet seat or a handicapped toilet? No   TIMED UP AND GO:  Was the test performed? No . Audio Visit   Cognitive Function:    02/16/2021    9:45 AM  MMSE - Mini Mental State Exam  Not completed: Refused        02/21/2023    8:58 AM 02/17/2022    9:43 AM  6CIT Screen  What Year? 0 points 0 points  What month? 0 points 0 points  What time? 0 points 0 points  Count back from 20 0 points 0 points  Months in reverse 0 points 0 points  Repeat phrase 0 points 0 points  Total Score 0 points 0 points    Immunizations Immunization History  Administered Date(s) Administered   COVID-19, mRNA, vaccine(Comirnaty)12 years and older 07/31/2022   PFIZER(Purple Top)SARS-COV-2 Vaccination 01/02/2020, 01/23/2020, 08/09/2020, 06/09/2021   Respiratory Syncytial Virus Vaccine,Recomb Aduvanted(Arexvy) 07/31/2022   Td 03/14/2010      Flu Vaccine status: Up to date  Pneumococcal vaccine status: Due, Education has been provided regarding the importance of this vaccine. Advised may receive this vaccine at local pharmacy or Health Dept. Aware to provide a copy of the vaccination record if obtained from local pharmacy or Health Dept. Verbalized acceptance and understanding.  Covid-19 vaccine status: Completed vaccines  Qualifies for Shingles Vaccine? Yes   Zostavax completed No   Shingrix Completed?: No.    Education has been provided regarding the importance of this vaccine. Patient has been advised to call insurance company to determine out of pocket expense if they have not yet received this vaccine. Advised may also receive vaccine at local  pharmacy or Health Dept. Verbalized acceptance and understanding.  Screening Tests Health Maintenance  Topic Date Due   COVID-19 Vaccine (6 - 2023-24 season) 03/09/2023 (Originally 09/25/2022)   Zoster Vaccines- Shingrix (1 of 2) 04/12/2023 (Originally 02/16/1973)   Pneumonia Vaccine 3+ Years old (1 of 2 - PCV) 02/21/2024 (Originally 02/17/1962)   INFLUENZA VACCINE  05/31/2023   Medicare Annual Wellness (AWV)  02/21/2024   COLONOSCOPY (Pts 45-43yrs Insurance coverage will need to be confirmed)  05/20/2028   Hepatitis C Screening  Completed   HPV VACCINES  Aged Out   DTaP/Tdap/Td  Discontinued    Health Maintenance  There are no preventive care reminders to display for this patient.   Colorectal cancer screening: Type of screening: Colonoscopy. Completed 05/20/21. Repeat every 7 years  Lung Cancer Screening: (Low Dose CT Chest recommended if Age 36-80 years, 30 pack-year currently smoking OR have quit w/in 15years.) does qualify.   Lung Cancer Screening Referral: Deferred  Additional Screening:  Hepatitis C Screening: does qualify; Completed 01/06/20  Vision Screening: Recommended annual ophthalmology exams for early detection of glaucoma and other disorders of the eye. Is the patient up to date with their annual eye exam?  Yes  Who is the provider or what is the name of the office in which the patient attends annual eye exams? Dr Shea Evans If pt is not established with a provider, would they like to be referred to a provider to establish care? No .   Dental Screening: Recommended annual dental exams for proper oral hygiene  Community Resource Referral / Chronic Care Management:  CRR required this visit?  No   CCM required this visit?  No      Plan:     I have personally reviewed and noted the following in the patient's chart:   Medical and social history Use of alcohol, tobacco or illicit drugs  Current medications and supplements including opioid prescriptions. Patient is  not currently taking opioid prescriptions. Functional ability and status Nutritional status Physical activity Advanced directives List of other physicians Hospitalizations, surgeries, and ER visits in previous 12 months Vitals Screenings to include cognitive, depression, and falls Referrals and appointments  In addition, I have reviewed and discussed with patient certain preventive protocols, quality metrics, and best practice recommendations. A written personalized care plan for preventive services as well as general preventive health recommendations were provided to patient.     Bryce Rung, LPN   02/05/8118   Nurse Notes:   None

## 2023-03-14 ENCOUNTER — Ambulatory Visit: Payer: Medicare PPO | Admitting: Urology

## 2023-03-23 ENCOUNTER — Ambulatory Visit: Payer: Medicare PPO | Admitting: Urology

## 2023-03-23 ENCOUNTER — Encounter: Payer: Self-pay | Admitting: Urology

## 2023-03-23 VITALS — BP 121/74 | HR 92 | Ht 69.0 in | Wt 176.0 lb

## 2023-03-23 DIAGNOSIS — N390 Urinary tract infection, site not specified: Secondary | ICD-10-CM

## 2023-03-23 DIAGNOSIS — N529 Male erectile dysfunction, unspecified: Secondary | ICD-10-CM | POA: Diagnosis not present

## 2023-03-23 DIAGNOSIS — Z87440 Personal history of urinary (tract) infections: Secondary | ICD-10-CM

## 2023-03-23 MED ORDER — TADALAFIL 20 MG PO TABS: ORAL_TABLET | ORAL | 6 refills | Status: DC

## 2023-03-23 NOTE — Progress Notes (Signed)
Bryce Cameron,acting as a scribe for Bryce Altes, MD.,have documented all relevant documentation on the behalf of Bryce Altes, MD,as directed by  Bryce Altes, MD while in the presence of Bryce Altes, MD.  03/23/2023 9:54 AM   Bryce Cameron Nov 23, 1955 [id number]  Referring provider: Georgina Quint, MD [address] [place],  [place] [address]  Chief Complaint  Patient presents with   Results    RUS     HPI: Bryce Cameron is a 67 y.o. male who follows up for BPH and recurrent UTI's.  Refer to my previous note on 02/07/2023 Doing well since last visit No bothersome LUTS Denies dysuria, gross hematuria Denies flank, abdominal or pelvic pain Tadalafil was effective with much less facial flushing and headache than sildenafil and he requested a refill   PMH: Past Medical History:  Diagnosis Date   Barrett's esophagus 03/13/2008   short and biopsies 2022 without intestinal metaplasia so resolved, plus original diagnosis made on less than 1 cm of change   DJD (degenerative joint disease)    GERD (gastroesophageal reflux disease)    Gout 2016   diagnosed 2016   Hx of adenomatous polyp of colon 03/13/2008   Hyperlipidemia    Wears glasses     Surgical History: Past Surgical History:  Procedure Laterality Date   COLONOSCOPY  2016   TA, CG   ELBOW ARTHROSCOPY Left 11/14/2013   Procedure: ARTHROSCOPY LEFT ELBOW, SYNOVECTOMY, DEBRIDEMENT, REMOVAL OF LOOSE BODIES;  Surgeon: Loreta Ave, MD;  Location: [facility];  Service: Orthopedics;  Laterality: Left;   KNEE ARTHROPLASTY  2008   right   KNEE ARTHROSCOPY  2012   left   NASAL POLYP EXCISION     SHOULDER ARTHROSCOPY  2011   left   SHOULDER ARTHROSCOPY W/ ROTATOR CUFF REPAIR  2008   right   SHOULDER INJECTION Left 11/14/2013   Procedure: SHOULDER INJECTION;  Surgeon: Loreta Ave, MD;  Location: [facility];  Service: Orthopedics;  Laterality: Left;    TONSILLECTOMY     UPPER GASTROINTESTINAL ENDOSCOPY     VASECTOMY      Home Medications:  Allergies as of 03/23/2023   No Known Allergies      Medication List        Accurate as of Mar 23, 2023  9:54 AM. If you have any questions, ask your nurse or doctor.          APPLE CIDER VINEGAR PO Take by mouth.   CALCIUM 1200 PO Take by mouth.   esomeprazole 20 MG capsule Commonly known as: NEXIUM Take 20 mg by mouth. Every 3 days   simvastatin 10 MG tablet Commonly known as: ZOCOR Take 1 tablet (10 mg total) by mouth at bedtime.   tadalafil 20 MG tablet Commonly known as: CIALIS Take 1 hour prior to intercourse.   tamsulosin 0.4 MG Caps capsule Commonly known as: FLOMAX Take 1 capsule (0.4 mg total) by mouth daily.   VITAMIN B-3 PO Take by mouth.   vitamin C 1000 MG tablet Take 1,000 mg by mouth daily.   ZINC 15 PO Take by mouth.        Family History: Family History  Problem Relation Age of Onset   Colon polyps Mother    Colon cancer Mother 34   Lung cancer Father    Heart disease Brother 61       had bypass surgery   Esophageal cancer Neg  Hx    Rectal cancer Neg Hx    Stomach cancer Neg Hx     Social History:  reports that he has been smoking cigars. He has never used smokeless tobacco. He reports current alcohol use. He reports that he does not use drugs.   Physical Exam: BP 121/74 (BP Location: Left Arm, Patient Position: Sitting, Cuff Size: Normal)   Pulse 92   Ht 5\' 9"  (1.753 m)   Wt 176 lb (79.8 kg)   BMI 25.99 kg/m   Constitutional:  Alert and oriented, No acute distress. HEENT: [place] AT, moist mucus membranes.  Trachea midline, no masses. Cardiovascular: No clubbing, cyanosis, or edema. Respiratory: Normal respiratory effort, no increased work of breathing. GI: Abdomen is soft, nontender, nondistended, no abdominal masses Skin: No rashes, bruises or suspicious lesions. Neurologic: Grossly intact, no focal deficits, moving all 4  extremities. Psychiatric: Normal mood and affect.  Pertinent Imaging:  Renal ultrasound was personally reviewed and interpreted  Ultrasound renal complete  Narrative CLINICAL DATA:  recurrent UTI  EXAM: RENAL / URINARY TRACT ULTRASOUND COMPLETE  COMPARISON:  None Available.  FINDINGS: Right Kidney:  Renal measurements: 13.8 x 5.5 x 6.2 cm = volume: 249 mL. Echogenicity within normal limits. No suspicious mass or hydronephrosis visualized. There are several benign cysts noted, measuring up to 3.6 cm (for which no dedicated imaging follow-up is recommended).  Left Kidney:  Renal measurements: 11.3 x 6.3 x 5.4 cm = volume: 201 mL. Echogenicity within normal limits. No suspicious mass or hydronephrosis visualized. There are several benign cysts noted, measuring up to 2.3 cm (for which no dedicated imaging follow-up is recommended).  Bladder:  Appears normal for degree of bladder distention.  Other:  Mildly increased hepatic echogenicity.  IMPRESSION: 1. No hydronephrosis. 2. Mildly increased hepatic echogenicity as can be seen in hepatic steatosis.   Electronically Signed By: Meda Klinefelter M.D. On: 02/14/2023 16:19  Assessment & Plan:    1. Recurrent UTI Most likely secondary to prostatitis Presently asymptomatic Return as needed for recurrent UTI symptoms  2. Erectile dysfunction Tadalafil effective with minimal side effects. Refills were sent to pharmacy He desires annual follow up  [facility] [address], [address] [place], [place] [address] [phone]

## 2023-03-26 ENCOUNTER — Encounter: Payer: Self-pay | Admitting: Urology

## 2023-07-12 IMAGING — CT CT NECK W/ CM
4 of 6 series · 12 of 35 positions shown, 14 images · IV contrast (APPLIED)
Comparison: Targeted ultrasound of the left neck 03/03/2022.

CLINICAL DATA: Provided history: Mass in neck. Jaw pain. Neck mass,
nonpulsatile.

EXAM:
CT NECK WITH CONTRAST
TECHNIQUE: Multidetector CT imaging of the neck was performed using the
standard protocol following the bolus administration of intravenous
contrast.

[Series 3: axial neck · axial · 0.58mm/px · z∈[-175,-81]mm · 2 of 142 slices shown, 3 images]
[im 48/142  soft-tissue]
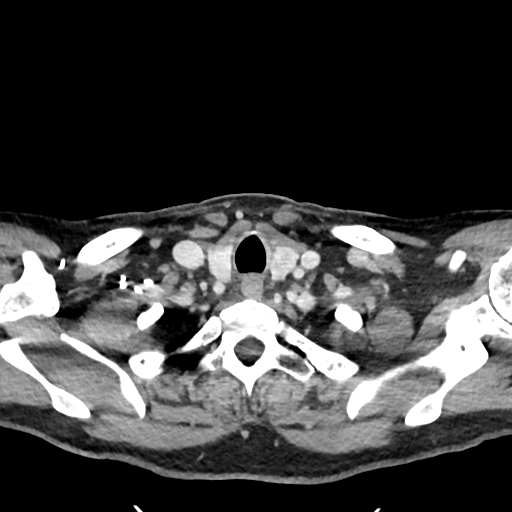
[im 48/142  bone]
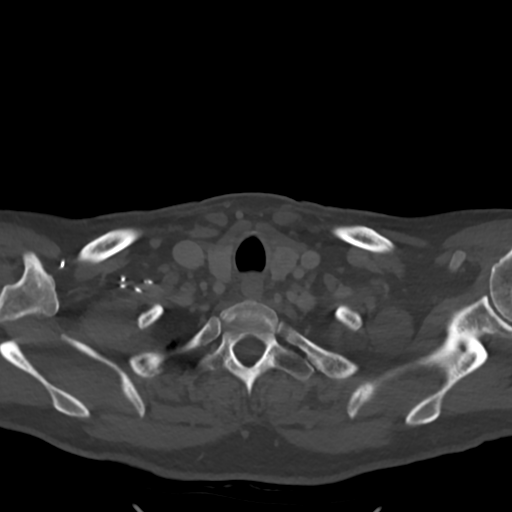
[im 95/142  bone]
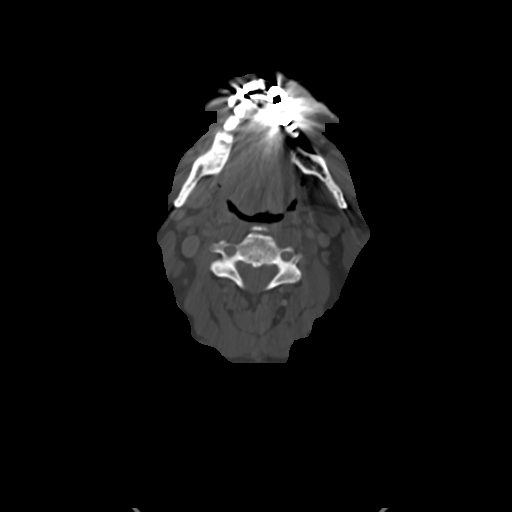

[Series 6: sag neck · sagittal · 0.52mm/px · 5 of 122 slices shown, 6 images]
[im 41/122  bone]
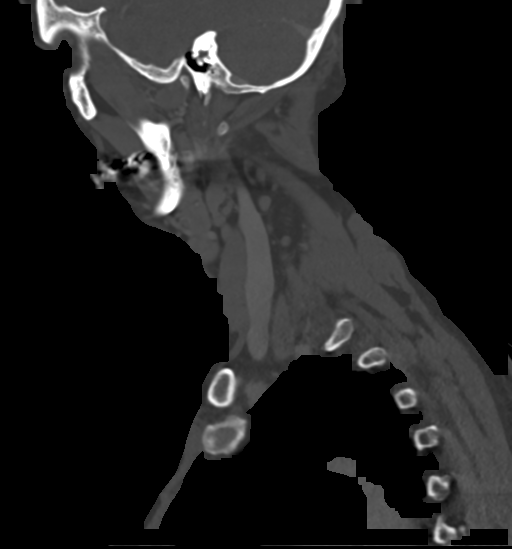
[im 51/122  bone]
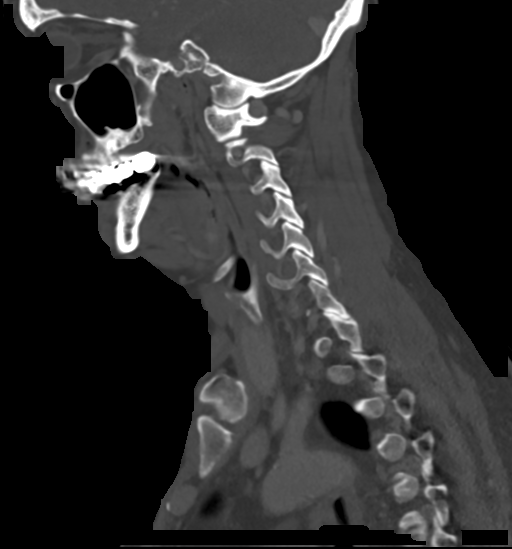
[im 61/122  soft-tissue]
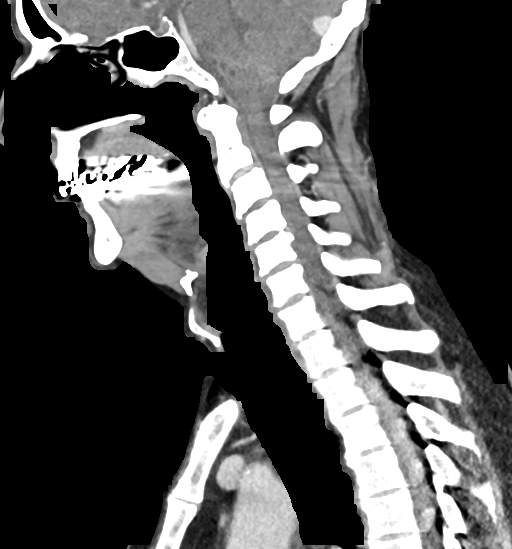
[im 61/122  bone]
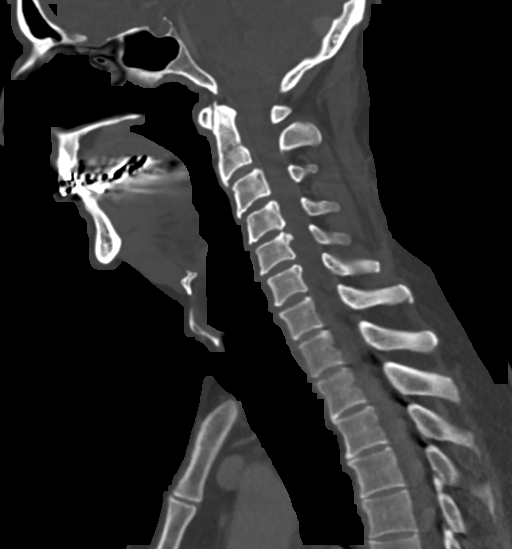
[im 71/122  bone]
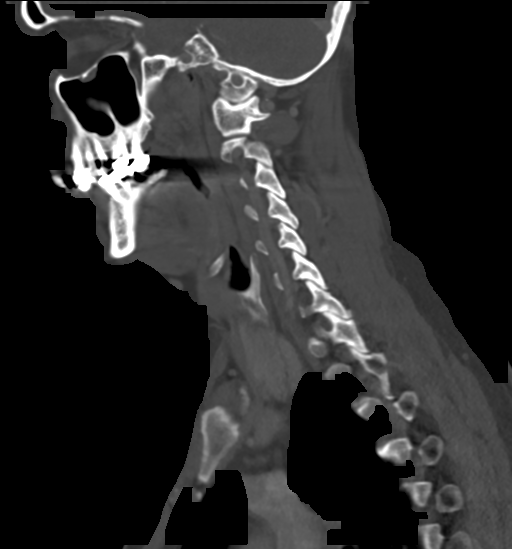
[im 81/122  bone]
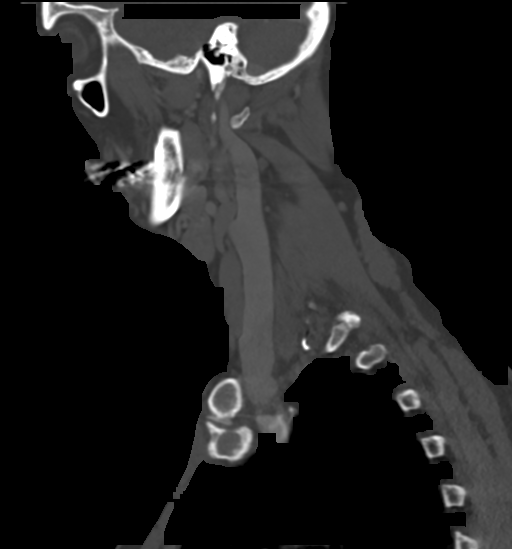

[Series 7: cor neck · coronal · 0.48mm/px · 3 of 133 slices shown]
[im 27/133  bone]
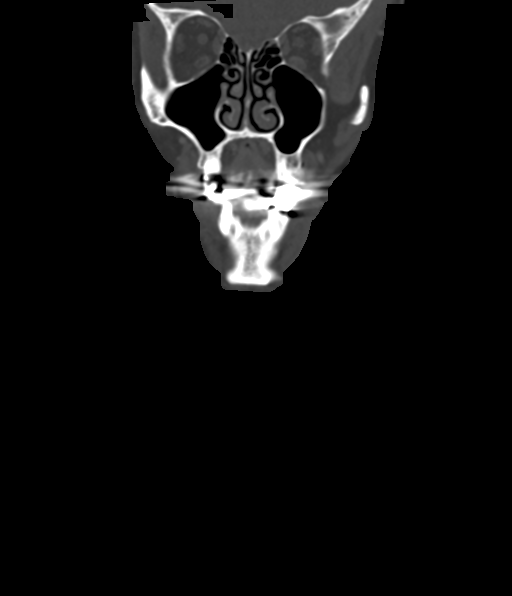
[im 53/133  bone]
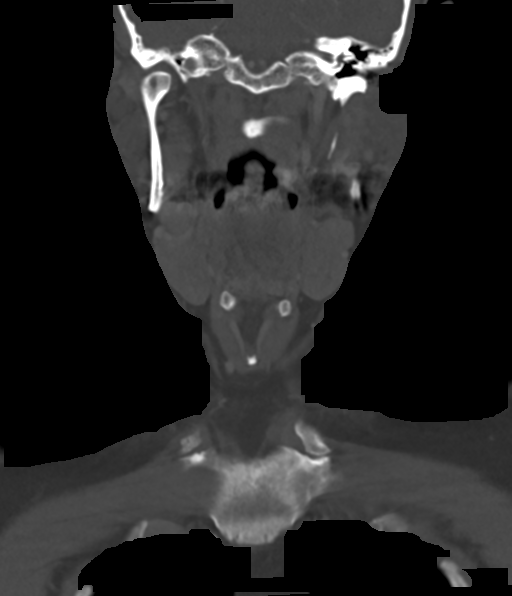
[im 80/133  bone]
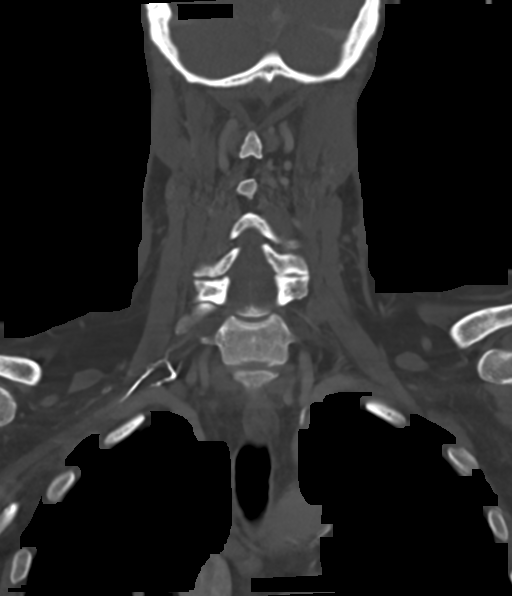

[Series 8: ax oropharynx · axial · 0.48mm/px · z∈[-170,-76]mm · 2 of 143 slices shown]
[im 48/143  bone]
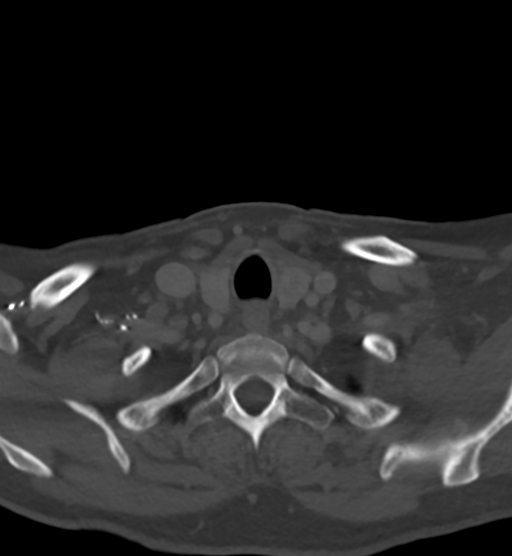
[im 95/143  bone]
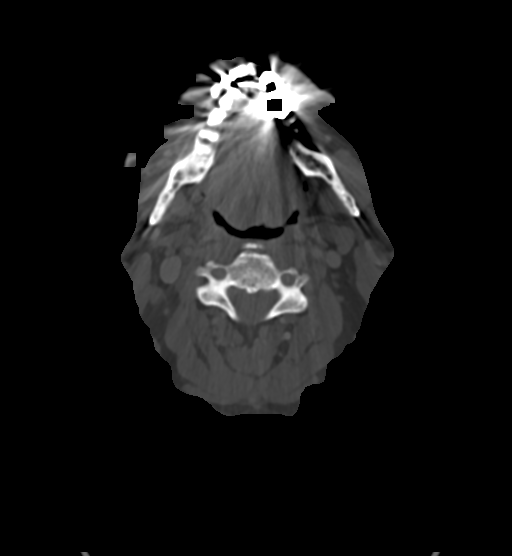

[12 of 35 positions shown; findings below may reference images not displayed]

RADIATION DOSE REDUCTION: This exam was performed according to the
departmental dose-optimization program which includes automated
exposure control, adjustment of the mA and/or kV according to
patient size and/or use of iterative reconstruction technique.

CONTRAST:  100mL OMNIPAQUE IOHEXOL 300 MG/ML  SOLN
FINDINGS: Pharynx and larynx: Streak and beam hardening artifact arising from
dental restoration partially obscures the oral cavity and
oropharynx. Within this limitation, there is no appreciable swelling
or discrete mass within the oral cavity, pharynx or larynx.

Salivary glands: 2.6 x 1.7 x 2.3 cm ovoid mass within the deep lobe
of the left parotid gland (series 3, image 35) (series 7, image 54).
12 mm ovoid nodule within the posteroinferior aspect of the right
parotid gland, which may reflect a nonspecific enlarged intraparotid
lymph node or a primary parotid neoplasm (series 6, image 30)
(series 3, image 34). Unremarkable appearance of the submandibular
glands.

Thyroid: Subcentimeter thyroid nodules, not meeting consensus
criteria for ultrasound follow-up.

Lymph nodes: Two enlarged left level 2 lymph nodes, measuring up to
16 mm in short axis (series 3, image 53). The more lateral enlarged
lymph node (overlying sternocleidomastoid muscle) lies immediately
deep to the skin surface marker.

Vascular: The major vascular structures of the neck are patent.
Atherosclerotic plaque within the visualized aortic arch, proximal
major branch vessels of the neck and carotid arteries.

Limited intracranial: No evidence of acute intracranial abnormality
within the field of view.

Visualized orbits: No orbital mass or acute orbital finding.

Mastoids and visualized paranasal sinuses: Mild mucosal thickening
within the imaged right frontal sinus and bilateral ethmoid air
cells. Tiny mucous retention cyst within the right maxillary sinus.
No significant mastoid effusion.

Skeleton: Cervical spondylosis. No acute fracture or aggressive
osseous lesion.

Upper chest: No consolidation within the imaged lung apices.
Centrilobular and paraseptal emphysema.

Impressions 1-3 will be called to the ordering clinician or
representative by the Radiologist Assistant, and communication
documented in the PACS or [REDACTED].
IMPRESSION: 2.6 x 2.3 cm ovoid mass within the deep lobe of the left parotid
gland, likely reflecting a primary parotid neoplasm.

Two enlarged left level 2 lymph nodes (measuring up to 16 mm in
short axis), suspicious for nodal metastatic disease. ENT
consultation is recommended and direct tissue sampling should be
considered.

12 mm ovoid nodule within the inferior aspect of the right parotid
gland, which may reflect a nonspecific enlarged intraparotid lymph
node or primary parotid neoplasm.

Streak and beam hardening artifact arising from dental restoration
partially obscures the oral cavity. Within this limitation, there is
no appreciable swelling or discrete mass within the oral cavity,
pharynx or larynx.

Paranasal sinus disease, as described.

Cervical spondylosis.

Aortic Atherosclerosis (GGP6P-W85.5) and Emphysema (GGP6P-6SF.8).

## 2023-08-06 ENCOUNTER — Other Ambulatory Visit: Payer: Self-pay | Admitting: Emergency Medicine

## 2023-08-06 ENCOUNTER — Telehealth: Payer: Self-pay | Admitting: Emergency Medicine

## 2023-08-06 DIAGNOSIS — N401 Enlarged prostate with lower urinary tract symptoms: Secondary | ICD-10-CM

## 2023-08-06 MED ORDER — TAMSULOSIN HCL 0.4 MG PO CAPS: 0.4000 mg | ORAL_CAPSULE | Freq: Every day | ORAL | 3 refills | Status: DC

## 2023-08-06 NOTE — Telephone Encounter (Signed)
New prescription for Flomax sent to pharmacy of record today.  Thanks

## 2023-08-06 NOTE — Telephone Encounter (Signed)
Prescription Request  08/06/2023  LOV: 01/10/2023  What is the name of the medication or equipment? tamsulosin (FLOMAX) 0.4 MG CAPS capsule   Have you contacted your pharmacy to request a refill? No   Which pharmacy would you like this sent to?  [id number] [place], [place] - [address] [place] [address] Phone: [phone] Fax: [phone]    Patient notified that their request is being sent to the clinical staff for review and that they should receive a response within 2 business days.   Please advise at Mobile [phone] (mobile)

## 2023-11-30 DIAGNOSIS — L820 Inflamed seborrheic keratosis: Secondary | ICD-10-CM | POA: Diagnosis not present

## 2023-12-21 DIAGNOSIS — L821 Other seborrheic keratosis: Secondary | ICD-10-CM | POA: Diagnosis not present

## 2023-12-21 DIAGNOSIS — L304 Erythema intertrigo: Secondary | ICD-10-CM | POA: Diagnosis not present

## 2023-12-21 DIAGNOSIS — L905 Scar conditions and fibrosis of skin: Secondary | ICD-10-CM | POA: Diagnosis not present

## 2023-12-21 DIAGNOSIS — L988 Other specified disorders of the skin and subcutaneous tissue: Secondary | ICD-10-CM | POA: Diagnosis not present

## 2023-12-21 DIAGNOSIS — L814 Other melanin hyperpigmentation: Secondary | ICD-10-CM | POA: Diagnosis not present

## 2023-12-21 DIAGNOSIS — D485 Neoplasm of uncertain behavior of skin: Secondary | ICD-10-CM | POA: Diagnosis not present

## 2023-12-29 ENCOUNTER — Other Ambulatory Visit: Payer: Self-pay | Admitting: Emergency Medicine

## 2023-12-29 DIAGNOSIS — E782 Mixed hyperlipidemia: Secondary | ICD-10-CM

## 2024-01-07 ENCOUNTER — Other Ambulatory Visit: Payer: Self-pay | Admitting: Emergency Medicine

## 2024-01-07 DIAGNOSIS — E782 Mixed hyperlipidemia: Secondary | ICD-10-CM

## 2024-01-07 NOTE — Telephone Encounter (Signed)
 Copied from CRM [id number]. Topic: Clinical - Medication Refill >> Jan 07, 2024 11:21 AM Elizebeth Brooking wrote: Most Recent Primary Care Visit:  Provider: Tillie Rung  Department: [facility]  Visit Type: MEDICARE AWV, SEQUENTIAL  Date: 02/21/2023  Medication: simvastatin (ZOCOR) 10 MG tablet  Has the patient contacted their pharmacy? Yes (Agent: If no, request that the patient contact the pharmacy for the refill. If patient does not wish to contact the pharmacy document the reason why and proceed with request.) (Agent: If yes, when and what did the pharmacy advise?)  Is this the correct pharmacy for this prescription? Yes If no, delete pharmacy and type the correct one.  This is the patient's preferred pharmacy:  [id number], [place] - [address] [place] [address] Phone: [phone] Fax: [phone]  [facility] - [place], [place] - [address] [place] [address] Phone: [phone] Fax: [phone]   Has the prescription been filled recently? No  Is the patient out of the medication? Yes  Has the patient been seen for an appointment in the last year OR does the patient have an upcoming appointment? Yes  Can we respond through MyChart? Yes  Agent: Please be advised that Rx refills may take up to 3 business days. We ask that you follow-up with your pharmacy.

## 2024-02-25 ENCOUNTER — Encounter: Admitting: Emergency Medicine

## 2024-02-26 ENCOUNTER — Ambulatory Visit (INDEPENDENT_AMBULATORY_CARE_PROVIDER_SITE_OTHER): Admitting: Emergency Medicine

## 2024-02-26 ENCOUNTER — Encounter: Payer: Self-pay | Admitting: Emergency Medicine

## 2024-02-26 VITALS — BP 110/86 | HR 90 | Temp 98.7°F | Ht 69.0 in | Wt 186.8 lb

## 2024-02-26 DIAGNOSIS — Z1329 Encounter for screening for other suspected endocrine disorder: Secondary | ICD-10-CM

## 2024-02-26 DIAGNOSIS — Z13220 Encounter for screening for lipoid disorders: Secondary | ICD-10-CM

## 2024-02-26 DIAGNOSIS — Z13228 Encounter for screening for other metabolic disorders: Secondary | ICD-10-CM | POA: Diagnosis not present

## 2024-02-26 DIAGNOSIS — Z130 Encounter for screening for diseases of the blood and blood-forming organs and certain disorders involving the immune mechanism: Secondary | ICD-10-CM

## 2024-02-26 DIAGNOSIS — Z1159 Encounter for screening for other viral diseases: Secondary | ICD-10-CM

## 2024-02-26 DIAGNOSIS — Z0000 Encounter for general adult medical examination without abnormal findings: Secondary | ICD-10-CM

## 2024-02-26 DIAGNOSIS — E782 Mixed hyperlipidemia: Secondary | ICD-10-CM

## 2024-02-26 DIAGNOSIS — Z114 Encounter for screening for human immunodeficiency virus [HIV]: Secondary | ICD-10-CM

## 2024-02-26 LAB — COMPREHENSIVE METABOLIC PANEL WITH GFR
ALT: 23 U/L (ref 0–53)
AST: 21 U/L (ref 0–37)
Albumin: 4.5 g/dL (ref 3.5–5.2)
Alkaline Phosphatase: 70 U/L (ref 39–117)
BUN: 20 mg/dL (ref 6–23)
CO2: 28 meq/L (ref 19–32)
Calcium: 9.7 mg/dL (ref 8.4–10.5)
Chloride: 102 meq/L (ref 96–112)
Creatinine, Ser: 0.92 mg/dL (ref 0.40–1.50)
GFR: 84.57 mL/min (ref >60.00)
Glucose, Bld: 110 mg/dL — ABNORMAL HIGH (ref 70–99)
Potassium: 4 meq/L (ref 3.5–5.1)
Sodium: 139 meq/L (ref 135–145)
Total Bilirubin: 0.6 mg/dL (ref 0.2–1.2)
Total Protein: 7.8 g/dL (ref 6.0–8.3)

## 2024-02-26 LAB — LIPID PANEL
Cholesterol: 220 mg/dL — ABNORMAL HIGH (ref 0–200)
HDL: 35.9 mg/dL — ABNORMAL LOW (ref >39.00)
LDL Cholesterol: 112 mg/dL — ABNORMAL HIGH (ref 0–99)
NonHDL: 184.29
Total CHOL/HDL Ratio: 6
Triglycerides: 360 mg/dL — ABNORMAL HIGH (ref 0.0–149.0)
VLDL: 72 mg/dL — ABNORMAL HIGH (ref 0.0–40.0)

## 2024-02-26 LAB — URINALYSIS, ROUTINE W REFLEX MICROSCOPIC
Bilirubin Urine: NEGATIVE
Hgb urine dipstick: NEGATIVE
Ketones, ur: NEGATIVE
Nitrite: NEGATIVE
Specific Gravity, Urine: 1.02 (ref 1.000–1.030)
Total Protein, Urine: NEGATIVE
Urine Glucose: NEGATIVE
Urobilinogen, UA: 0.2 (ref 0.0–1.0)
pH: 6 (ref 5.0–8.0)

## 2024-02-26 LAB — CBC WITH DIFFERENTIAL/PLATELET
Basophils Absolute: 0.1 10*3/uL (ref 0.0–0.1)
Basophils Relative: 0.7 % (ref 0.0–3.0)
Eosinophils Absolute: 0.2 10*3/uL (ref 0.0–0.7)
Eosinophils Relative: 1.1 % (ref 0.0–5.0)
HCT: 50.5 % (ref 39.0–52.0)
Hemoglobin: 17 g/dL (ref 13.0–17.0)
Lymphocytes Relative: 29 % (ref 12.0–46.0)
Lymphs Abs: 4.7 10*3/uL — ABNORMAL HIGH (ref 0.7–4.0)
MCHC: 33.6 g/dL (ref 30.0–36.0)
MCV: 96.1 fl (ref 78.0–100.0)
Monocytes Absolute: 1.4 10*3/uL — ABNORMAL HIGH (ref 0.1–1.0)
Monocytes Relative: 8.6 % (ref 3.0–12.0)
Neutro Abs: 9.8 10*3/uL — ABNORMAL HIGH (ref 1.4–7.7)
Neutrophils Relative %: 60.6 % (ref 43.0–77.0)
Platelets: 328 10*3/uL (ref 150.0–400.0)
RBC: 5.25 Mil/uL (ref 4.22–5.81)
RDW: 13.3 % (ref 11.5–15.5)
WBC: 16.2 10*3/uL — ABNORMAL HIGH (ref 4.0–10.5)

## 2024-02-26 LAB — HEMOGLOBIN A1C: Hgb A1c MFr Bld: 6 % (ref 4.6–6.5)

## 2024-02-26 MED ORDER — SIMVASTATIN 10 MG PO TABS: 10.0000 mg | ORAL_TABLET | Freq: Every day | ORAL | 3 refills | Status: AC

## 2024-02-26 NOTE — Patient Instructions (Signed)
 Health Maintenance, Male  Adopting a healthy lifestyle and getting preventive care are important in promoting health and wellness. Ask your health care provider about:  The right schedule for you to have regular tests and exams.  Things you can do on your own to prevent diseases and keep yourself healthy.  What should I know about diet, weight, and exercise?  Eat a healthy diet    Eat a diet that includes plenty of vegetables, fruits, low-fat dairy products, and lean protein.  Do not eat a lot of foods that are high in solid fats, added sugars, or sodium.  Maintain a healthy weight  Body mass index (BMI) is a measurement that can be used to identify possible weight problems. It estimates body fat based on height and weight. Your health care provider can help determine your BMI and help you achieve or maintain a healthy weight.  Get regular exercise  Get regular exercise. This is one of the most important things you can do for your health. Most adults should:  Exercise for at least 150 minutes each week. The exercise should increase your heart rate and make you sweat (moderate-intensity exercise).  Do strengthening exercises at least twice a week. This is in addition to the moderate-intensity exercise.  Spend less time sitting. Even light physical activity can be beneficial.  Watch cholesterol and blood lipids  Have your blood tested for lipids and cholesterol at 70 years of age, then have this test every 5 years.  You may need to have your cholesterol levels checked more often if:  Your lipid or cholesterol levels are high.  You are older than 70 years of age.  You are at high risk for heart disease.  What should I know about cancer screening?  Many types of cancers can be detected early and may often be prevented. Depending on your health history and family history, you may need to have cancer screening at various ages. This may include screening for:  Colorectal cancer.  Prostate cancer.  Skin cancer.  Lung  cancer.  What should I know about heart disease, diabetes, and high blood pressure?  Blood pressure and heart disease  High blood pressure causes heart disease and increases the risk of stroke. This is more likely to develop in people who have high blood pressure readings or are overweight.  Talk with your health care provider about your target blood pressure readings.  Have your blood pressure checked:  Every 3-5 years if you are 9-95 years of age.  Every year if you are 85 years old or older.  If you are between the ages of 29 and 29 and are a current or former smoker, ask your health care provider if you should have a one-time screening for abdominal aortic aneurysm (AAA).  Diabetes  Have regular diabetes screenings. This checks your fasting blood sugar level. Have the screening done:  Once every three years after age 23 if you are at a normal weight and have a low risk for diabetes.  More often and at a younger age if you are overweight or have a high risk for diabetes.  What should I know about preventing infection?  Hepatitis B  If you have a higher risk for hepatitis B, you should be screened for this virus. Talk with your health care provider to find out if you are at risk for hepatitis B infection.  Hepatitis C  Blood testing is recommended for:  Everyone born from [birth year] through 1967.  Anyone  with known risk factors for hepatitis C.  Sexually transmitted infections (STIs)  You should be screened each year for STIs, including gonorrhea and chlamydia, if:  You are sexually active and are younger than 70 years of age.  You are older than 70 years of age and your health care provider tells you that you are at risk for this type of infection.  Your sexual activity has changed since you were last screened, and you are at increased risk for chlamydia or gonorrhea. Ask your health care provider if you are at risk.  Ask your health care provider about whether you are at high risk for HIV. Your health care provider  may recommend a prescription medicine to help prevent HIV infection. If you choose to take medicine to prevent HIV, you should first get tested for HIV. You should then be tested every 3 months for as long as you are taking the medicine.  Follow these instructions at home:  Alcohol use  Do not drink alcohol if your health care provider tells you not to drink.  If you drink alcohol:  Limit how much you have to 0-2 drinks a day.  Know how much alcohol is in your drink. In the [place] one drink equals one 12 oz bottle of beer (355 mL), one 5 oz glass of wine (148 mL), or one 1 oz glass of hard liquor (44 mL).  Lifestyle  Do not use any products that contain nicotine or tobacco. These products include cigarettes, chewing tobacco, and vaping devices, such as e-cigarettes. If you need help quitting, ask your health care provider.  Do not use street drugs.  Do not share needles.  Ask your health care provider for help if you need support or information about quitting drugs.  General instructions  Schedule regular health, dental, and eye exams.  Stay current with your vaccines.  Tell your health care provider if:  You often feel depressed.  You have ever been abused or do not feel safe at home.  Summary  Adopting a healthy lifestyle and getting preventive care are important in promoting health and wellness.  Follow your health care provider's instructions about healthy diet, exercising, and getting tested or screened for diseases.  Follow your health care provider's instructions on monitoring your cholesterol and blood pressure.  This information is not intended to replace advice given to you by your health care provider. Make sure you discuss any questions you have with your health care provider.  Document Revised: 03/07/2021 Document Reviewed: 03/07/2021  Elsevier Patient Education  2024 [employer].

## 2024-02-26 NOTE — Progress Notes (Signed)
 Bryce Cameron 68 y.o.   Chief Complaint  Patient presents with   Genital Warts    Reoccurs in pubic area, has seen derm for this in the past   Annual Exam    HISTORY OF PRESENT ILLNESS: This is a 68 y.o. male here for annual exam Has history of recurrent genital warts No other complaints or medical concerns today.  HPI   Prior to Admission medications   Medication Sig Start Date End Date Taking? Authorizing Provider  APPLE CIDER VINEGAR PO Take by mouth.   Yes [provider]  Ascorbic Acid (VITAMIN C) 1000 MG tablet Take 1,000 mg by mouth daily.   Yes [provider]  Calcium Carbonate-Vit D-Min (CALCIUM 1200 PO) Take by mouth.   Yes [provider]  esomeprazole (NEXIUM) 20 MG capsule Take 20 mg by mouth. Every 3 days   Yes [provider]  Niacin (VITAMIN B-3 PO) Take by mouth.   Yes [provider]  simvastatin  (ZOCOR ) 10 MG tablet Take 1 tablet (10 mg total) by mouth at bedtime. 01/10/23  Yes Judithann Villamar, Isidro Margo, MD  tadalafil  (CIALIS ) 20 MG tablet Take 1 hour prior to intercourse. 03/23/23  Yes Stoioff, Kizzie Perks, MD  tamsulosin  (FLOMAX ) 0.4 MG CAPS capsule Take 1 capsule (0.4 mg total) by mouth daily. 08/06/23  Yes Dallis Czaja Jose, MD  Zinc Sulfate (ZINC 15 PO) Take by mouth.   Yes [provider]    No Known Allergies  Patient Active Problem List   Diagnosis Date Noted   History of recurrent UTIs 01/10/2023   Dyslipidemia 01/10/2023   Acute UTI 01/10/2023   Current smoker 01/10/2023   Mass in neck 03/03/2022   Epidermoid cyst of ear 07/14/2021   Prediabetes 02/21/2021   Benign prostatic hyperplasia with urinary hesitancy 01/13/2021   Peripheral neuropathy 01/13/2021   Erectile dysfunction 01/06/2020   Lower urinary tract symptoms 01/06/2020   Mixed hyperlipidemia 06/10/2010   GERD 06/10/2010   Hx of adenomatous polyp of colon 03/13/2008    Past Medical History:  Diagnosis Date   Barrett's esophagus  03/13/2008   short and biopsies 2022 without intestinal metaplasia so resolved, plus original diagnosis made on less than 1 cm of change   DJD (degenerative joint disease)    GERD (gastroesophageal reflux disease)    Gout 2016   diagnosed 2016   Hx of adenomatous polyp of colon 03/13/2008   Hyperlipidemia    Wears glasses     Past Surgical History:  Procedure Laterality Date   COLONOSCOPY  2016   TA, CG   ELBOW ARTHROSCOPY Left 11/14/2013   Procedure: ARTHROSCOPY LEFT ELBOW, SYNOVECTOMY, DEBRIDEMENT, REMOVAL OF LOOSE BODIES;  Surgeon: Ferd Householder, MD;  Location: [facility];  Service: Orthopedics;  Laterality: Left;   KNEE ARTHROPLASTY  2008   right   KNEE ARTHROSCOPY  2012   left   NASAL POLYP EXCISION     SHOULDER ARTHROSCOPY  2011   left   SHOULDER ARTHROSCOPY W/ ROTATOR CUFF REPAIR  2008   right   SHOULDER INJECTION Left 11/14/2013   Procedure: SHOULDER INJECTION;  Surgeon: Ferd Householder, MD;  Location: [facility];  Service: Orthopedics;  Laterality: Left;   TONSILLECTOMY     UPPER GASTROINTESTINAL ENDOSCOPY     VASECTOMY      Social History   Socioeconomic History   Marital status: Divorced    Spouse name: Not on file   Number of children: 3  Years of education: college   Highest education level: Associate degree: occupational, Scientist, product/process development, or vocational program  Occupational History   Not on file  Tobacco Use   Smoking status: Some Days    Types: Cigars    Last attempt to quit: 11/12/2011    Years since quitting: 12.2   Smokeless tobacco: Never   Tobacco comments:    maybe a cigar when golfing  Vaping Use   Vaping status: Never Used  Substance and Sexual Activity   Alcohol use: Yes    Alcohol/week: 0.0 standard drinks of alcohol    Comment: rarely, beer once a month   Drug use: Never   Sexual activity: Yes    Birth control/protection: None  Other Topics Concern   Not on file  Social History Narrative   01/06/20    From: born in [place], but in [place] since 1989   Living: with son Bryce Cameron   Work: was working with [employer] - has been unemployed since [employer]      Family: has 3 children (son Bryce Cameron, Designer, multimedia; daughter - died, one adopted child      Enjoys: play golf and soccer (but has not been playing since Covid)      Exercise: golf, would like to go back to soccer, exercising occasionally   Diet: eats responsibly, cooks at home      Safety   Seat belts: Yes    Guns: Yes  and secure   Safe in relationships: Yes    Social Drivers of Health   Financial Resource Strain: Medium Risk (02/22/2024)   Overall Financial Resource Strain (CARDIA)    Difficulty of Paying Living Expenses: Somewhat hard  Food Insecurity: Food Insecurity Present (02/22/2024)   Hunger Vital Sign    Worried About Running Out of Food in the Last Year: Sometimes true    Ran Out of Food in the Last Year: Sometimes true  Transportation Needs: No Transportation Needs (02/22/2024)   PRAPARE - Administrator, Civil Service (Medical): No    Lack of Transportation (Non-Medical): No  Physical Activity: Sufficiently Active (02/22/2024)   Exercise Vital Sign    Days of Exercise per Week: 3 days    Minutes of Exercise per Session: 50 min  Stress: No Stress Concern Present (02/22/2024)   [employer] of Occupational Health - Occupational Stress Questionnaire    Feeling of Stress : Only a little  Social Connections: Socially Isolated (02/22/2024)   Social Connection and Isolation Panel [NHANES]    Frequency of Communication with Friends and Family: More than three times a week    Frequency of Social Gatherings with Friends and Family: Twice a week    Attends Religious Services: Never    Database administrator or Organizations: No    Attends Engineer, structural: Not on file    Marital Status: Divorced  Intimate Partner Violence: Not At Risk (02/21/2023)   Humiliation, Afraid, Rape, and Kick questionnaire    Fear of Current  or Ex-Partner: No    Emotionally Abused: No    Physically Abused: No    Sexually Abused: No    Family History  Problem Relation Age of Onset   Colon polyps Mother    Colon cancer Mother 69   Lung cancer Father    Heart disease Brother 86       had bypass surgery   Esophageal cancer Neg Hx    Rectal cancer Neg Hx    Stomach cancer Neg Hx  Review of Systems  Constitutional: Negative.  Negative for chills and fever.  HENT: Negative.  Negative for congestion and sore throat.   Respiratory: Negative.  Negative for cough and shortness of breath.   Cardiovascular: Negative.  Negative for chest pain and palpitations.  Gastrointestinal:  Negative for abdominal pain, nausea and vomiting.  Genitourinary: Negative.  Negative for dysuria and hematuria.  Skin: Negative.  Negative for rash.  Neurological:  Negative for dizziness and headaches.  All other systems reviewed and are negative.   Vitals:   02/26/24 1514  BP: 110/86  Pulse: 90  Temp: 98.7 F (37.1 C)  SpO2: 94%    Physical Exam Vitals reviewed.  Constitutional:      Appearance: Normal appearance.  HENT:     Head: Normocephalic.     Right Ear: Tympanic membrane, ear canal and external ear normal.     Left Ear: Tympanic membrane, ear canal and external ear normal.     Mouth/Throat:     Mouth: Mucous membranes are moist.     Pharynx: Oropharynx is clear.  Eyes:     Extraocular Movements: Extraocular movements intact.     Conjunctiva/sclera: Conjunctivae normal.     Pupils: Pupils are equal, round, and reactive to light.  Cardiovascular:     Rate and Rhythm: Normal rate and regular rhythm.     Pulses: Normal pulses.     Heart sounds: Normal heart sounds.  Pulmonary:     Effort: Pulmonary effort is normal.     Breath sounds: Normal breath sounds.  Abdominal:     Palpations: Abdomen is soft.     Tenderness: There is no abdominal tenderness.  Musculoskeletal:     Cervical back: No tenderness.   Lymphadenopathy:     Cervical: No cervical adenopathy.  Skin:    General: Skin is warm and dry.     Capillary Refill: Capillary refill takes less than 2 seconds.  Neurological:     General: No focal deficit present.     Mental Status: He is alert and oriented to person, place, and time.  Psychiatric:        Mood and Affect: Mood normal.        Behavior: Behavior normal.      ASSESSMENT & PLAN: Problem List Items Addressed This Visit       Other   Mixed hyperlipidemia   Relevant Medications   simvastatin  (ZOCOR ) 10 MG tablet   Other Visit Diagnoses       Routine general medical examination at a health care facility    -  Primary   Relevant Orders   CBC with Differential/Platelet   Comprehensive metabolic panel with GFR   Hemoglobin A1c   Lipid panel   HIV Antibody (routine testing w rflx)   Hepatitis C antibody   Urine Culture   Urinalysis     Screening for deficiency anemia       Relevant Orders   CBC with Differential/Platelet     Screening for lipoid disorders       Relevant Orders   Lipid panel     Screening for endocrine, metabolic and immunity disorder       Relevant Orders   Comprehensive metabolic panel with GFR   Hemoglobin A1c   Urine Culture   Urinalysis     Encounter for screening for HIV       Relevant Orders   HIV Antibody (routine testing w rflx)     Need for hepatitis C screening test  Relevant Orders   Hepatitis C antibody      Modifiable risk factors discussed with patient. Anticipatory guidance according to age provided. The following topics were also discussed: Social Determinants of Health Smoking and cardiovascular/cancer risk associated with this.  Smoking cessation advice given. Diet and nutrition Benefits of exercise Cancer screening and review of colonoscopy report from 2022 Vaccinations review and recommendations Cardiovascular risk assessment The 10-year ASCVD risk score (Arnett DK, et al., 2019) is: 21.3%   Values  used to calculate the score:     Age: 78 years     Sex: Male     Is Non-Hispanic African American: No     Diabetic: No     Tobacco smoker: Yes     Systolic Blood Pressure: 110 mmHg     Is BP treated: No     HDL Cholesterol: 34.3 mg/dL     Total Cholesterol: 224 mg/dL  Mental health including depression and anxiety Fall and accident prevention  Patient Instructions  Health Maintenance, Male Adopting a healthy lifestyle and getting preventive care are important in promoting health and wellness. Ask your health care provider about: The right schedule for you to have regular tests and exams. Things you can do on your own to prevent diseases and keep yourself healthy. What should I know about diet, weight, and exercise? Eat a healthy diet  Eat a diet that includes plenty of vegetables, fruits, low-fat dairy products, and lean protein. Do not eat a lot of foods that are high in solid fats, added sugars, or sodium. Maintain a healthy weight Body mass index (BMI) is a measurement that can be used to identify possible weight problems. It estimates body fat based on height and weight. Your health care provider can help determine your BMI and help you achieve or maintain a healthy weight. Get regular exercise Get regular exercise. This is one of the most important things you can do for your health. Most adults should: Exercise for at least 150 minutes each week. The exercise should increase your heart rate and make you sweat (moderate-intensity exercise). Do strengthening exercises at least twice a week. This is in addition to the moderate-intensity exercise. Spend less time sitting. Even light physical activity can be beneficial. Watch cholesterol and blood lipids Have your blood tested for lipids and cholesterol at 70 years of age, then have this test every 5 years. You may need to have your cholesterol levels checked more often if: Your lipid or cholesterol levels are high. You are older  than 70 years of age. You are at high risk for heart disease. What should I know about cancer screening? Many types of cancers can be detected early and may often be prevented. Depending on your health history and family history, you may need to have cancer screening at various ages. This may include screening for: Colorectal cancer. Prostate cancer. Skin cancer. Lung cancer. What should I know about heart disease, diabetes, and high blood pressure? Blood pressure and heart disease High blood pressure causes heart disease and increases the risk of stroke. This is more likely to develop in people who have high blood pressure readings or are overweight. Talk with your health care provider about your target blood pressure readings. Have your blood pressure checked: Every 3-5 years if you are 7-20 years of age. Every year if you are 81 years old or older. If you are between the ages of 83 and 52 and are a current or former smoker, ask your health  care provider if you should have a one-time screening for abdominal aortic aneurysm (AAA). Diabetes Have regular diabetes screenings. This checks your fasting blood sugar level. Have the screening done: Once every three years after age 67 if you are at a normal weight and have a low risk for diabetes. More often and at a younger age if you are overweight or have a high risk for diabetes. What should I know about preventing infection? Hepatitis B If you have a higher risk for hepatitis B, you should be screened for this virus. Talk with your health care provider to find out if you are at risk for hepatitis B infection. Hepatitis C Blood testing is recommended for: Everyone born from [birth year] through 1967. Anyone with known risk factors for hepatitis C. Sexually transmitted infections (STIs) You should be screened each year for STIs, including gonorrhea and chlamydia, if: You are sexually active and are younger than 70 years of age. You are older than  70 years of age and your health care provider tells you that you are at risk for this type of infection. Your sexual activity has changed since you were last screened, and you are at increased risk for chlamydia or gonorrhea. Ask your health care provider if you are at risk. Ask your health care provider about whether you are at high risk for HIV. Your health care provider may recommend a prescription medicine to help prevent HIV infection. If you choose to take medicine to prevent HIV, you should first get tested for HIV. You should then be tested every 3 months for as long as you are taking the medicine. Follow these instructions at home: Alcohol use Do not drink alcohol if your health care provider tells you not to drink. If you drink alcohol: Limit how much you have to 0-2 drinks a day. Know how much alcohol is in your drink. In the [place] one drink equals one 12 oz bottle of beer (355 mL), one 5 oz glass of wine (148 mL), or one 1 oz glass of hard liquor (44 mL). Lifestyle Do not use any products that contain nicotine or tobacco. These products include cigarettes, chewing tobacco, and vaping devices, such as e-cigarettes. If you need help quitting, ask your health care provider. Do not use street drugs. Do not share needles. Ask your health care provider for help if you need support or information about quitting drugs. General instructions Schedule regular health, dental, and eye exams. Stay current with your vaccines. Tell your health care provider if: You often feel depressed. You have ever been abused or do not feel safe at home. Summary Adopting a healthy lifestyle and getting preventive care are important in promoting health and wellness. Follow your health care provider's instructions about healthy diet, exercising, and getting tested or screened for diseases. Follow your health care provider's instructions on monitoring your cholesterol and blood pressure. This information is not  intended to replace advice given to you by your health care provider. Make sure you discuss any questions you have with your health care provider. Document Revised: 03/07/2021 Document Reviewed: 03/07/2021 Elsevier Patient Education  2024 Elsevier Inc.    Maryagnes Small, MD [facility]

## 2024-02-27 ENCOUNTER — Encounter: Payer: Self-pay | Admitting: Emergency Medicine

## 2024-02-27 ENCOUNTER — Ambulatory Visit: Payer: Medicare PPO

## 2024-02-27 VITALS — BP 110/86 | Ht 69.0 in | Wt 186.0 lb

## 2024-02-27 DIAGNOSIS — Z0000 Encounter for general adult medical examination without abnormal findings: Secondary | ICD-10-CM

## 2024-02-27 LAB — URINE CULTURE: Result:: NO GROWTH

## 2024-02-27 LAB — HEPATITIS C ANTIBODY: Hepatitis C Ab: NONREACTIVE

## 2024-02-27 LAB — HIV ANTIBODY (ROUTINE TESTING W REFLEX): HIV 1&2 Ab, 4th Generation: NONREACTIVE

## 2024-02-27 NOTE — Progress Notes (Signed)
 Subjective:   Bryce Cameron is a 68 y.o. who presents for a Medicare Wellness preventive visit.  Visit Complete: Virtual I connected with  [name] on 02/27/24 by a audio enabled telemedicine application and verified that I am speaking with the correct person using two identifiers.  Patient Location: Home  Provider Location: Office/Clinic  I discussed the limitations of evaluation and management by telemedicine. The patient expressed understanding and agreed to proceed.  Vital Signs: Because this visit was a virtual/telehealth visit, some criteria may be missing or patient reported. Any vitals not documented were not able to be obtained and vitals that have been documented are patient reported.  VideoDeclined- This patient declined Librarian, academic. Therefore the visit was completed with audio only.  Persons Participating in Visit: Patient.  AWV Questionnaire: No: Patient Medicare AWV questionnaire was not completed prior to this visit.  Cardiac Risk Factors include: advanced age (>17men, >21 women);dyslipidemia;male gender     Objective:    Today's Vitals   02/27/24 0852  BP: 110/86  Weight: 186 lb (84.4 kg)  Height: 5\' 9"  (1.753 m)   Body mass index is 27.47 kg/m.     02/27/2024    8:52 AM 02/21/2023    8:58 AM 12/13/2022   12:40 PM 09/07/2022    9:22 AM 02/17/2022    9:42 AM 02/16/2021    9:40 AM 04/30/2015    7:58 AM  Advanced Directives  Does Patient Have a Medical Advance Directive? Yes No No No No No No  Type of Estate agent of [place];Living will        Does patient want to make changes to medical advance directive?     No - Patient declined    Copy of Healthcare Power of Attorney in Chart? No - copy requested    No - copy requested    Would patient like information on creating a medical advance directive?  No - Patient declined   No - Patient declined No - Patient declined No - patient declined information     Current Medications (verified) Outpatient Encounter Medications as of 02/27/2024  Medication Sig   APPLE CIDER VINEGAR PO Take by mouth.   Ascorbic Acid (VITAMIN C) 1000 MG tablet Take 1,000 mg by mouth daily.   Calcium Carbonate-Vit D-Min (CALCIUM 1200 PO) Take by mouth.   esomeprazole (NEXIUM) 20 MG capsule Take 20 mg by mouth. Every 3 days   Niacin (VITAMIN B-3 PO) Take by mouth.   simvastatin  (ZOCOR ) 10 MG tablet Take 1 tablet (10 mg total) by mouth at bedtime.   tadalafil  (CIALIS ) 20 MG tablet Take 1 hour prior to intercourse.   tamsulosin  (FLOMAX ) 0.4 MG CAPS capsule Take 1 capsule (0.4 mg total) by mouth daily.   Zinc Sulfate (ZINC 15 PO) Take by mouth.   No facility-administered encounter medications on file as of 02/27/2024.    Allergies (verified) Patient has no known allergies.   History: Past Medical History:  Diagnosis Date   Barrett's esophagus 03/13/2008   short and biopsies 2022 without intestinal metaplasia so resolved, plus original diagnosis made on less than 1 cm of change   DJD (degenerative joint disease)    GERD (gastroesophageal reflux disease)    Gout 2016   diagnosed 2016   Hx of adenomatous polyp of colon 03/13/2008   Hyperlipidemia    Wears glasses    Past Surgical History:  Procedure Laterality Date   COLONOSCOPY  2016   TA, CG  ELBOW ARTHROSCOPY Left 11/14/2013   Procedure: ARTHROSCOPY LEFT ELBOW, SYNOVECTOMY, DEBRIDEMENT, REMOVAL OF LOOSE BODIES;  Surgeon: Ferd Householder, MD;  Location: [facility];  Service: Orthopedics;  Laterality: Left;   KNEE ARTHROPLASTY  2008   right   KNEE ARTHROSCOPY  2012   left   NASAL POLYP EXCISION     SHOULDER ARTHROSCOPY  2011   left   SHOULDER ARTHROSCOPY W/ ROTATOR CUFF REPAIR  2008   right   SHOULDER INJECTION Left 11/14/2013   Procedure: SHOULDER INJECTION;  Surgeon: Ferd Householder, MD;  Location:  SURGERY CENTER;  Service: Orthopedics;  Laterality: Left;   TONSILLECTOMY      UPPER GASTROINTESTINAL ENDOSCOPY     VASECTOMY     Family History  Problem Relation Age of Onset   Colon polyps Mother    Colon cancer Mother 48   Lung cancer Father    Heart disease Brother 28       had bypass surgery   Esophageal cancer Neg Hx    Rectal cancer Neg Hx    Stomach cancer Neg Hx    Social History   Socioeconomic History   Marital status: Divorced    Spouse name: Not on file   Number of children: 3   Years of education: college   Highest education level: Associate degree: occupational, Scientist, product/process development, or vocational program  Occupational History   Not on file  Tobacco Use   Smoking status: Some Days    Types: Cigars    Last attempt to quit: 11/12/2011    Years since quitting: 12.3    Passive exposure: Current   Smokeless tobacco: Never   Tobacco comments:    maybe a cigar when golfing  Vaping Use   Vaping status: Never Used  Substance and Sexual Activity   Alcohol use: Not Currently    Alcohol/week: 1.0 standard drink of alcohol    Types: 1 Cans of beer per week    Comment: rarely, beer once a month   Drug use: Never   Sexual activity: Yes    Birth control/protection: None  Other Topics Concern   Not on file  Social History Narrative   01/06/20   From: born in [place], but in [place] since 1989   Living: with son [name]   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 [employer]      Family: has 3 children (son [name], Designer, multimedia; daughter - died, one adopted child      Enjoys: play golf and soccer (but has not been playing since Covid)      Exercise: golf, would like to go back to soccer, exercising occasionally   Diet: eats responsibly, cooks at home      Safety   Seat belts: Yes    Guns: Yes  and secure   Safe in relationships: Yes    Social Drivers of Health   Financial Resource Strain: Low Risk  (02/27/2024)   Overall Financial Resource Strain (CARDIA)    Difficulty of Paying Living Expenses: Not hard at all  Recent Concern: Financial  Resource Strain - Medium Risk (02/22/2024)   Overall Financial Resource Strain (CARDIA)    Difficulty of Paying Living Expenses: Somewhat hard  Food Insecurity: No Food Insecurity (02/27/2024)   Hunger Vital Sign    Worried About Running Out of Food in the Last Year: Never true    Ran Out of Food in the Last Year: Never true  Recent Concern: Food Insecurity -  Food Insecurity Present (02/22/2024)   Hunger Vital Sign    Worried About Running Out of Food in the Last Year: Sometimes true    Ran Out of Food in the Last Year: Sometimes true  Transportation Needs: No Transportation Needs (02/27/2024)   PRAPARE - Administrator, Civil Service (Medical): No    Lack of Transportation (Non-Medical): No  Physical Activity: Sufficiently Active (02/27/2024)   Exercise Vital Sign    Days of Exercise per Week: 4 days    Minutes of Exercise per Session: 60 min  Stress: No Stress Concern Present (02/27/2024)   [employer] of Occupational Health - Occupational Stress Questionnaire    Feeling of Stress : Not at all  Social Connections: Moderately Integrated (02/27/2024)   Social Connection and Isolation Panel [NHANES]    Frequency of Communication with Friends and Family: More than three times a week    Frequency of Social Gatherings with Friends and Family: More than three times a week    Attends Religious Services: More than 4 times per year    Active Member of [employer] or Organizations: Yes    Attends Engineer, structural: More than 4 times per year    Marital Status: Divorced  Recent Concern: Social Connections - Socially Isolated (02/22/2024)   Social Connection and Isolation Panel [NHANES]    Frequency of Communication with Friends and Family: More than three times a week    Frequency of Social Gatherings with Friends and Family: Twice a week    Attends Religious Services: Never    Database administrator or Organizations: No    Attends Engineer, structural: Not on file     Marital Status: Divorced    Tobacco Counseling Ready to quit: Not Answered Counseling given: Not Answered Tobacco comments: maybe a cigar when golfing    Clinical Intake:  Pre-visit preparation completed: Yes  Pain : No/denies pain     BMI - recorded: 27.47 Nutritional Risks: None Diabetes: No  Lab Results  Component Value Date   HGBA1C 6.0 02/26/2024   HGBA1C 5.9 01/10/2023   HGBA1C 6.1 02/20/2022     How often do you need to have someone help you when you read instructions, pamphlets, or other written materials from your doctor or pharmacy?: 1 - Never  Interpreter Needed?: No  Information entered by :: Kandy Orris, CMA   Activities of Daily Living     02/27/2024    8:55 AM  In your present state of health, do you have any difficulty performing the following activities:  Hearing? 0  Vision? 0  Difficulty concentrating or making decisions? 0  Walking or climbing stairs? 0  Dressing or bathing? 0  Doing errands, shopping? 0  Preparing Food and eating ? N  Using the Toilet? N  In the past six months, have you accidently leaked urine? N  Do you have problems with loss of bowel control? N  Managing your Medications? N  Managing your Finances? N  Housekeeping or managing your Housekeeping? N    Patient Care Team: Elvira Hammersmith, MD as PCP - General (Internal Medicine) Alto Atta, Patricia Boon (Ophthalmology)  Indicate any recent Medical Services you may have received from other than [facility] providers in the past year (date may be approximate).     Assessment:   This is a routine wellness examination for Grey.  Hearing/Vision screen Hearing Screening - Comments:: Denies hearing difficulties   Vision Screening - Comments:: Wears rx glasses -  up to date with routine eye exams with Dr Alto Atta   Goals Addressed               This Visit's Progress     Patient Stated (pt-stated)        Patient stated he plans to stay alive and active.       Depression  Screen     02/27/2024    8:57 AM 02/21/2023    8:56 AM 01/10/2023   10:22 AM 02/17/2022    9:38 AM 02/16/2021    9:42 AM 01/06/2020    9:14 AM  PHQ 2/9 Scores  PHQ - 2 Score 0 0 3 0 0 0  PHQ- 9 Score 1 0 9  0     Fall Risk     02/27/2024    8:58 AM 02/21/2023    8:57 AM 02/20/2023    9:45 AM 01/10/2023   10:21 AM 02/17/2022    9:41 AM  Fall Risk   Falls in the past year? 1 0 0 0 0  Number falls in past yr: 0 0  0 0  Comment 1      Injury with Fall? 1 0  0 0  Comment no fracture to hip      Risk for fall due to :  No Fall Risks  No Fall Risks No Fall Risks  Follow up Falls evaluation completed;Falls prevention discussed Falls prevention discussed  Falls evaluation completed     MEDICARE RISK AT HOME:  Medicare Risk at Home Any stairs in or around the home?: No If so, are there any without handrails?: No Home free of loose throw rugs in walkways, pet beds, electrical cords, etc?: Yes Adequate lighting in your home to reduce risk of falls?: Yes Life alert?: No Use of a cane, walker or w/c?: No Grab bars in the bathroom?: Yes Shower chair or bench in shower?: No Elevated toilet seat or a handicapped toilet?: No  TIMED UP AND GO:  Was the test performed?  No  Cognitive Function: 6CIT completed    02/16/2021    9:45 AM  MMSE - Mini Mental State Exam  Not completed: Refused        02/27/2024    8:59 AM 02/21/2023    8:58 AM 02/17/2022    9:43 AM  6CIT Screen  What Year? 0 points 0 points 0 points  What month? 0 points 0 points 0 points  What time? 0 points 0 points 0 points  Count back from 20 0 points 0 points 0 points  Months in reverse 0 points 0 points 0 points  Repeat phrase 0 points 0 points 0 points  Total Score 0 points 0 points 0 points    Immunizations Immunization History  Administered Date(s) Administered   PFIZER(Purple Top)SARS-COV-2 Vaccination 01/02/2020, 01/23/2020, 08/09/2020, 06/09/2021   Pfizer(Comirnaty)Fall Seasonal Vaccine 12 years and older  07/31/2022   Respiratory Syncytial Virus Vaccine,Recomb Aduvanted(Arexvy) 07/31/2022   Td 03/14/2010    Screening Tests Health Maintenance  Topic Date Due   Pneumonia Vaccine 70+ Years old (1 of 2 - PCV) Never done   Zoster Vaccines- Shingrix (1 of 2) Never done   COVID-19 Vaccine (6 - 2024-25 season) 07/01/2023   INFLUENZA VACCINE  05/30/2024   Medicare Annual Wellness (AWV)  02/26/2025   Colonoscopy  05/20/2028   Hepatitis C Screening  Completed   HPV VACCINES  Aged Out   Meningococcal B Vaccine  Aged Out   DTaP/Tdap/Td  Discontinued  Health Maintenance  Health Maintenance Due  Topic Date Due   Pneumonia Vaccine 27+ Years old (1 of 2 - PCV) Never done   Zoster Vaccines- Shingrix (1 of 2) Never done   COVID-19 Vaccine (6 - 2024-25 season) 07/01/2023   Health Maintenance Items Addressed:02/27/2024  Pt has been asked to obtain an immunization report from local pharmacy since stated received a Tdap.  Educated and advised on getting the Shingles, Pneumonia, and COVID vaccines at local pharmacy.   Additional Screening:  Vision Screening: Recommended annual ophthalmology exams for early detection of glaucoma and other disorders of the eye.  Dental Screening: Recommended annual dental exams for proper oral hygiene  Community Resource Referral / Chronic Care Management: CRR required this visit?  No   CCM required this visit?  No     Plan:     I have personally reviewed and noted the following in the patient's chart:   Medical and social history Use of alcohol, tobacco or illicit drugs  Current medications and supplements including opioid prescriptions. Patient is not currently taking opioid prescriptions. Functional ability and status Nutritional status Physical activity Advanced directives List of other physicians Hospitalizations, surgeries, and ER visits in previous 12 months Vitals Screenings to include cognitive, depression, and falls Referrals and  appointments  In addition, I have reviewed and discussed with patient certain preventive protocols, quality metrics, and best practice recommendations. A written personalized care plan for preventive services as well as general preventive health recommendations were provided to patient.     Patria Bookbinder, CMA   02/27/2024   After Visit Summary: (MyChart) Due to this being a telephonic visit, the after visit summary with patients personalized plan was offered to patient via MyChart   Notes: Nothing significant to report at this time.

## 2024-02-27 NOTE — Patient Instructions (Signed)
 Bryce Cameron , Thank you for taking time to come for your Medicare Wellness Visit. I appreciate your ongoing commitment to your health goals. Please review the following plan we discussed and let me know if I can assist you in the future.   Referrals/Orders/Follow-Ups/Clinician Recommendations: Aim for 30 minutes of exercise or brisk walking, 6-8 glasses of water, and 5 servings of fruits and vegetables each day. Educated and advised on getting the Pneumonia, COVID, and Shingles vaccines in 2025 at local pharmacy.  This is a list of the screening recommended for you and due dates:  Health Maintenance  Topic Date Due   Pneumonia Vaccine (1 of 2 - PCV) Never done   Zoster (Shingles) Vaccine (1 of 2) Never done   COVID-19 Vaccine (6 - 2024-25 season) 07/01/2023   Flu Shot  05/30/2024   Medicare Annual Wellness Visit  02/26/2025   Colon Cancer Screening  05/20/2028   Hepatitis C Screening  Completed   HPV Vaccine  Aged Out   Meningitis B Vaccine  Aged Out   DTaP/Tdap/Td vaccine  Discontinued    Advanced directives: (Copy Requested) Please bring a copy of your health care power of attorney and living will to the office to be added to your chart at your convenience. You can mail to [facility] [address] [place], [place] [address] or email to [email or web address]  Next Medicare Annual Wellness Visit scheduled for next year: Yes

## 2024-03-26 ENCOUNTER — Ambulatory Visit (INDEPENDENT_AMBULATORY_CARE_PROVIDER_SITE_OTHER): Payer: Self-pay | Admitting: Urology

## 2024-03-26 ENCOUNTER — Encounter: Payer: Self-pay | Admitting: Urology

## 2024-03-26 VITALS — BP 106/72 | HR 99 | Ht 69.0 in | Wt 178.0 lb

## 2024-03-26 DIAGNOSIS — A630 Anogenital (venereal) warts: Secondary | ICD-10-CM

## 2024-03-26 DIAGNOSIS — N401 Enlarged prostate with lower urinary tract symptoms: Secondary | ICD-10-CM | POA: Diagnosis not present

## 2024-03-26 DIAGNOSIS — R3911 Hesitancy of micturition: Secondary | ICD-10-CM

## 2024-03-26 DIAGNOSIS — N390 Urinary tract infection, site not specified: Secondary | ICD-10-CM

## 2024-03-26 DIAGNOSIS — N5201 Erectile dysfunction due to arterial insufficiency: Secondary | ICD-10-CM | POA: Diagnosis not present

## 2024-03-26 MED ORDER — TADALAFIL 20 MG PO TABS
ORAL_TABLET | ORAL | 0 refills | Status: AC
Start: 2024-03-26 — End: ?

## 2024-03-26 NOTE — Progress Notes (Signed)
 I, Bryce Cameron, acting as a Neurosurgeon for Bryce Knapp, MD., have documented all relevant documentation on the behalf of Bryce Knapp, MD, as directed by Bryce Knapp, MD while in the presence of Bryce Knapp, MD.  Discussed the use of AI scribe software for clinical note transcription with the patient, who gave verbal consent to proceed.   03/26/2024 9:41 AM   Bryce Cameron May 16, 1956 [id number]  Referring provider: Elvira Hammersmith, MD [address] [place],  [place] [address]  Chief Complaint  Patient presents with   Erectile Dysfunction   Urologic history 1. BPH with LUTS Tamsulosin  0.4 mg daily. PVR 02/17/23 0 mL  2. Recurrent UTI Renal ultrasound 02/17/23 with no significant upper tract abnormalities. No urinary calculi hydronephrosis. Bilateral simple renal cysts.   3. Erectile dysfunction Tadalafil  20 mg PRN.  HPI: Bryce Cameron is a 68 y.o. male presents for annual follow-up.  Since his last visit, denies recurrent UTIs.  Stable lower urinary tract symptoms on tamsulosin .  His biggest complaint are genital warts in his left inguinal region. He is being treated by a dermatologist in [place], and has a follow-up appointment in the near future. Denies dysuria, gross heamturia  No flank, abdominal, or pelvic pain.   PMH: Past Medical History:  Diagnosis Date   Barrett's esophagus 03/13/2008   short and biopsies 2022 without intestinal metaplasia so resolved, plus original diagnosis made on less than 1 cm of change   DJD (degenerative joint disease)    GERD (gastroesophageal reflux disease)    Gout 2016   diagnosed 2016   Hx of adenomatous polyp of colon 03/13/2008   Hyperlipidemia    Wears glasses     Surgical History: Past Surgical History:  Procedure Laterality Date   COLONOSCOPY  2016   TA, CG   ELBOW ARTHROSCOPY Left 11/14/2013   Procedure: ARTHROSCOPY LEFT ELBOW, SYNOVECTOMY, DEBRIDEMENT, REMOVAL OF LOOSE BODIES;  Surgeon:  Ferd Householder, MD;  Location: [facility];  Service: Orthopedics;  Laterality: Left;   KNEE ARTHROPLASTY  2008   right   KNEE ARTHROSCOPY  2012   left   NASAL POLYP EXCISION     SHOULDER ARTHROSCOPY  2011   left   SHOULDER ARTHROSCOPY W/ ROTATOR CUFF REPAIR  2008   right   SHOULDER INJECTION Left 11/14/2013   Procedure: SHOULDER INJECTION;  Surgeon: Ferd Householder, MD;  Location: [facility];  Service: Orthopedics;  Laterality: Left;   TONSILLECTOMY     UPPER GASTROINTESTINAL ENDOSCOPY     VASECTOMY      Home Medications:  Allergies as of 03/26/2024   No Known Allergies      Medication List        Accurate as of Mar 26, 2024  9:41 AM. If you have any questions, ask your nurse or doctor.          APPLE CIDER VINEGAR PO Take by mouth.   CALCIUM 1200 PO Take by mouth.   esomeprazole 20 MG capsule Commonly known as: NEXIUM Take 20 mg by mouth. Every 3 days   simvastatin  10 MG tablet Commonly known as: ZOCOR  Take 1 tablet (10 mg total) by mouth at bedtime.   tadalafil  20 MG tablet Commonly known as: CIALIS  Take 1 hour prior to intercourse.   tamsulosin  0.4 MG Caps capsule Commonly known as: FLOMAX  Take 1 capsule (0.4 mg total) by mouth daily.   VITAMIN B-3 PO Take by mouth.  vitamin C 1000 MG tablet Take 1,000 mg by mouth daily.   ZINC 15 PO Take by mouth.        Allergies: No Known Allergies  Family History: Family History  Problem Relation Age of Onset   Colon polyps Mother    Colon cancer Mother 81   Lung cancer Father    Heart disease Brother 104       had bypass surgery   Esophageal cancer Neg Hx    Rectal cancer Neg Hx    Stomach cancer Neg Hx     Social History:  reports that he has been smoking cigars. He has been exposed to tobacco smoke. He has never used smokeless tobacco. He reports that he does not currently use alcohol after a past usage of about 1.0 standard drink of alcohol per week. He reports  that he does not use drugs.   Physical Exam: BP 106/72   Pulse 99   Ht 5\' 9"  (1.753 m)   Wt 178 lb (80.7 kg)   BMI 26.29 kg/m   Constitutional:  Alert and oriented, No acute distress. HEENT: [place] AT Respiratory: Normal respiratory effort, no increased work of breathing. GU: Small condyloma right penile shaft, largest measuring 5 mm; a large grouping of condyloma in the left inguinal area.  Psychiatric: Normal mood and affect.   Assessment & Plan:    1. BPH with LUTS Stable on tamsulosin   2. Recurrent UTI Infection free last 12 months  3. Erectile dysfunction Tadalafil  refilled  4. Condyloma acuminata Being treated by dermatology in [facility] Discussed with patient CO2 laser ablation for persistent lesions after his dermatology follow-up  I have reviewed the above documentation for accuracy and completeness, and I agree with the above.   Bryce Knapp, MD  [facility] [address] [place], [place] [address] [phone]

## 2024-04-08 ENCOUNTER — Encounter: Payer: Self-pay | Admitting: Emergency Medicine

## 2024-04-09 ENCOUNTER — Other Ambulatory Visit: Payer: Self-pay | Admitting: Radiology

## 2024-04-09 DIAGNOSIS — E782 Mixed hyperlipidemia: Secondary | ICD-10-CM

## 2024-04-09 MED ORDER — SIMVASTATIN 20 MG PO TABS: 20.0000 mg | ORAL_TABLET | Freq: Every day | ORAL | 3 refills | Status: AC

## 2024-05-19 ENCOUNTER — Encounter: Payer: Self-pay | Admitting: Internal Medicine

## 2024-05-19 ENCOUNTER — Ambulatory Visit (INDEPENDENT_AMBULATORY_CARE_PROVIDER_SITE_OTHER): Admitting: Internal Medicine

## 2024-05-19 ENCOUNTER — Ambulatory Visit: Payer: Self-pay

## 2024-05-19 VITALS — BP 112/70 | HR 93 | Temp 97.9°F | Ht 69.0 in | Wt 188.0 lb

## 2024-05-19 DIAGNOSIS — F17200 Nicotine dependence, unspecified, uncomplicated: Secondary | ICD-10-CM

## 2024-05-19 DIAGNOSIS — L02419 Cutaneous abscess of limb, unspecified: Secondary | ICD-10-CM | POA: Insufficient documentation

## 2024-05-19 DIAGNOSIS — R7303 Prediabetes: Secondary | ICD-10-CM | POA: Diagnosis not present

## 2024-05-19 MED ORDER — DOXYCYCLINE HYCLATE 100 MG PO TABS: 100.0000 mg | ORAL_TABLET | Freq: Two times a day (BID) | ORAL | 0 refills | Status: AC

## 2024-05-19 NOTE — Assessment & Plan Note (Signed)
 Lab Results  Component Value Date   HGBA1C 6.0 02/26/2024   Stable, pt to continue current medical treatment  - diet, wt control,

## 2024-05-19 NOTE — Progress Notes (Signed)
 Patient ID: Bryce Cameron, male   DOB: 02-17-1956, 68 y.o.   MRN: [id number]        Chief Complaint: follow up left prepatellar abscess, pre DM, smoker       HPI:  Bryce Cameron is a 68 y.o. male here with c/o insect bite of some kind to anterior left knee x 2 days getting rapidly worse with redness, swelling and pain, and even pinched drained some fluid out this am.  No fever, chills, trauma or falls.  Was playing golf and wants to go again tomorrow  Pt denies chest pain, increased sob or doe, wheezing, orthopnea, PND, increased LE swelling, palpitations, dizziness or syncope.   Pt denies polydipsia, polyuria, or new focal neuro s/s.   Pt still smoking, not ready to quit       Wt Readings from Last 3 Encounters:  05/19/24 188 lb (85.3 kg)  03/26/24 178 lb (80.7 kg)  02/27/24 186 lb (84.4 kg)   BP Readings from Last 3 Encounters:  05/19/24 112/70  03/26/24 106/72  02/27/24 110/86         Past Medical History:  Diagnosis Date   Barrett's esophagus 03/13/2008   short and biopsies 2022 without intestinal metaplasia so resolved, plus original diagnosis made on less than 1 cm of change   DJD (degenerative joint disease)    GERD (gastroesophageal reflux disease)    Gout 2016   diagnosed 2016   Hx of adenomatous polyp of colon 03/13/2008   Hyperlipidemia    Wears glasses    Past Surgical History:  Procedure Laterality Date   COLONOSCOPY  2016   TA, CG   ELBOW ARTHROSCOPY Left 11/14/2013   Procedure: ARTHROSCOPY LEFT ELBOW, SYNOVECTOMY, DEBRIDEMENT, REMOVAL OF LOOSE BODIES;  Surgeon: Toribio JULIANNA Chancy, MD;  Location: [facility];  Service: Orthopedics;  Laterality: Left;   KNEE ARTHROPLASTY  2008   right   KNEE ARTHROSCOPY  2012   left   NASAL POLYP EXCISION     SHOULDER ARTHROSCOPY  2011   left   SHOULDER ARTHROSCOPY W/ ROTATOR CUFF REPAIR  2008   right   SHOULDER INJECTION Left 11/14/2013   Procedure: SHOULDER INJECTION;  Surgeon: Toribio JULIANNA Chancy, MD;  Location: [facility];  Service: Orthopedics;  Laterality: Left;   TONSILLECTOMY     UPPER GASTROINTESTINAL ENDOSCOPY     VASECTOMY      reports that he has been smoking cigars. He has been exposed to tobacco smoke. He has never used smokeless tobacco. He reports that he does not currently use alcohol after a past usage of about 1.0 standard drink of alcohol per week. He reports that he does not use drugs. family history includes Colon cancer (age of onset: 42) in his mother; Colon polyps in his mother; Heart disease (age of onset: 63) in his brother; Lung cancer in his father. No Known Allergies Current Outpatient Medications on File Prior to Visit  Medication Sig Dispense Refill   APPLE CIDER VINEGAR PO Take by mouth.     Ascorbic Acid (VITAMIN C) 1000 MG tablet Take 1,000 mg by mouth daily.     Calcium Carbonate-Vit D-Min (CALCIUM 1200 PO) Take by mouth.     esomeprazole (NEXIUM) 20 MG capsule Take 20 mg by mouth. Every 3 days     Niacin (VITAMIN B-3 PO) Take by mouth.     simvastatin  (ZOCOR ) 10 MG tablet Take 1 tablet (10 mg total) by mouth at bedtime. 90 tablet 3  simvastatin  (ZOCOR ) 20 MG tablet Take 1 tablet (20 mg total) by mouth at bedtime. 90 tablet 3   tadalafil  (CIALIS ) 20 MG tablet Take 1 hour prior to intercourse. 90 tablet 0   tamsulosin  (FLOMAX ) 0.4 MG CAPS capsule Take 1 capsule (0.4 mg total) by mouth daily. 90 capsule 3   Zinc Sulfate (ZINC 15 PO) Take by mouth.     No current facility-administered medications on file prior to visit.        ROS:  All others reviewed and negative.  Objective        PE:  BP 112/70   Pulse 93   Temp 97.9 F (36.6 C)   Ht 5' 9 (1.753 m)   Wt 188 lb (85.3 kg)   SpO2 95%   BMI 27.76 kg/m                 Constitutional: Pt appears in NAD               HENT: Head: NCAT.                Right Ear: External ear normal.                 Left Ear: External ear normal.                Eyes: . Pupils are equal, round, and reactive to light.  Conjunctivae and EOM are normal               Nose: without d/c or deformity               Neck: Neck supple. Gross normal ROM               Cardiovascular: Normal rate and regular rhythm.                 Pulmonary/Chest: Effort normal and breath sounds without rales or wheezing.                               Neurological: Pt is alert. At baseline orientation, motor grossly intact               Skin: Skin is warm. LE edema - none; left prepatella with 1.5 cm area red, tender, swelling with no active drainage               Psychiatric: Pt behavior is normal without agitation   Micro: none  Cardiac tracings I have personally interpreted today:  none  Pertinent Radiological findings (summarize): none   Lab Results  Component Value Date   WBC 16.2 (H) 02/26/2024   HGB 17.0 02/26/2024   HCT 50.5 02/26/2024   PLT 328.0 02/26/2024   GLUCOSE 110 (H) 02/26/2024   CHOL 220 (H) 02/26/2024   TRIG 360.0 (H) 02/26/2024   HDL 35.90 (L) 02/26/2024   LDLDIRECT 172.0 01/10/2023   LDLCALC 112 (H) 02/26/2024   ALT 23 02/26/2024   AST 21 02/26/2024   NA 139 02/26/2024   K 4.0 02/26/2024   CL 102 02/26/2024   CREATININE 0.92 02/26/2024   BUN 20 02/26/2024   CO2 28 02/26/2024   TSH 1.46 01/13/2021   PSA 0.90 02/20/2022   HGBA1C 6.0 02/26/2024   Assessment/Plan:  Bryce Cameron is a 68 y.o. White or Caucasian [1] male with  has a past medical history of Barrett's esophagus (03/13/2008), DJD (degenerative joint disease), GERD (gastroesophageal  reflux disease), Gout (2016), adenomatous polyp of colon (03/13/2008), Hyperlipidemia, and Wears glasses.  Current smoker Pt counsled to quit, pt not ready  Prediabetes Lab Results  Component Value Date   HGBA1C 6.0 02/26/2024   Stable, pt to continue current medical treatment  - diet, wt control,   Prepatellar abscess New onset early smallish; has no hx of MRSA but for doxycycline  100 bid course, and   to f/u any worsening symptoms or  concerns  Followup: Return if symptoms worsen or fail to improve.  Bryce Rush, MD 05/19/2024 1:40 PM [facility]

## 2024-05-19 NOTE — Assessment & Plan Note (Signed)
 Pt counsled to quit, pt not ready

## 2024-05-19 NOTE — Telephone Encounter (Signed)
 FYI Only or Action Required?: FYI only for provider.  Patient was last seen in primary care on 02/26/2024 by Purcell Emil Schanz, MD.  Called Nurse Triage reporting Insect Bite.  Symptoms began several days ago.  Interventions attempted: OTC medications: neosporin, triple abx ointment, ibuprofen, hot/cold packs, salt water.  Symptoms are: gradually worsening.  Triage Disposition: See HCP Within 4 Hours (Or PCP Triage)  Patient/caregiver understands and will follow disposition?: Yes  Copied from CRM [id number]. Topic: Clinical - Red Word Triage >> May 19, 2024 12:30 PM Aleatha C wrote: Red Word that prompted transfer to Nurse Triage: extremely painful spider bite on knee, and it pusie and infected Reason for Disposition  [1] SEVERE bite pain (e.g., excruciating) AND [2] not improved after 2 hours of pain medicine  Answer Assessment - Initial Assessment Questions 1. TYPE of INSECT: What type of insect was it?      Spider 2. ONSET: When did you get bitten?      05/15/24 3. LOCATION: Where is the insect bite located?      Left Knee 4. REDNESS: Is the area red or pink? If Yes, ask: What size is the area of redness? (inches or cm). When did the redness start?     Red 5. PAIN: Is there any pain? If Yes, ask: How bad is the pain? (Scale 0-10; or none, mild, moderate, severe)     9/10 6. ITCHING: Does it itch? If Yes, ask: How bad is the itch?      No 7. SWELLING: How big is the swelling? (e.g., inches, cm, or compare to coins)     Entire kneecap 8. OTHER SYMPTOMS: Do you have any other symptoms?  (e.g., difficulty breathing, fever, hives)     Pain, discharge, edema  Answer Assessment - Initial Assessment Questions 1. TYPE of SPIDER: What type of spider was it?  (e.g., name, unknown, or brief description)     Unknown, walked into a web while playing golf 05/15/24 2. LOCATION: Where is the bite located?      Left knee 3. PAIN: Is there any pain? If Yes,  ask: How bad is it?  (Scale 1-10; or mild, moderate, severe)     9/10 4. SWELLING: How big is the swelling? (Inches, cm or compare to coins)      Entire kneecap 5. ONSET: When did the bite occur? (e.g., minutes, hours ago)      05/15/24 7. OTHER SYMPTOMS: Do you have any other symptoms?  (e.g., muscle cramps, abdomen pain, change in urine color)     Hole and when I squeeze it it is painful with pus coming out  Protocols used: Insect Bite-A-AH, Spider Bite - [employer]

## 2024-05-19 NOTE — Patient Instructions (Signed)
 Please take all new medication as prescribed - the antibiotic  Please continue all other medications as before, and refills have been done if requested.  Please have the pharmacy call with any other refills you may need.  Please keep your appointments with your specialists as you may have planned

## 2024-05-19 NOTE — Assessment & Plan Note (Signed)
 New onset early smallish; has no hx of MRSA but for doxycycline  100 bid course, and   to f/u any worsening symptoms or concerns

## 2024-07-30 ENCOUNTER — Other Ambulatory Visit: Payer: Self-pay | Admitting: Emergency Medicine

## 2024-07-30 DIAGNOSIS — N401 Enlarged prostate with lower urinary tract symptoms: Secondary | ICD-10-CM

## 2024-09-11 DIAGNOSIS — Z801 Family history of malignant neoplasm of trachea, bronchus and lung: Secondary | ICD-10-CM | POA: Diagnosis not present

## 2024-09-11 DIAGNOSIS — F17210 Nicotine dependence, cigarettes, uncomplicated: Secondary | ICD-10-CM | POA: Diagnosis not present

## 2024-09-11 DIAGNOSIS — E785 Hyperlipidemia, unspecified: Secondary | ICD-10-CM | POA: Diagnosis not present

## 2024-09-11 DIAGNOSIS — F325 Major depressive disorder, single episode, in full remission: Secondary | ICD-10-CM | POA: Diagnosis not present

## 2024-09-11 DIAGNOSIS — N400 Enlarged prostate without lower urinary tract symptoms: Secondary | ICD-10-CM | POA: Diagnosis not present

## 2024-09-11 DIAGNOSIS — Z791 Long term (current) use of non-steroidal anti-inflammatories (NSAID): Secondary | ICD-10-CM | POA: Diagnosis not present

## 2024-09-11 DIAGNOSIS — Z800 Family history of malignant neoplasm of digestive organs: Secondary | ICD-10-CM | POA: Diagnosis not present

## 2024-09-11 DIAGNOSIS — Z8249 Family history of ischemic heart disease and other diseases of the circulatory system: Secondary | ICD-10-CM | POA: Diagnosis not present

## 2025-03-04 ENCOUNTER — Encounter: Admitting: Emergency Medicine

## 2025-03-04 ENCOUNTER — Ambulatory Visit

## 2025-03-26 ENCOUNTER — Ambulatory Visit: Admitting: Urology
